# Patient Record
Sex: Male | Born: 1995 | Race: Black or African American | Hispanic: No | Marital: Single | Smoking: Never smoker
Health system: Southern US, Community
[De-identification: ages and names within clinical notes are randomized; demographics above are authoritative.]

## PROBLEM LIST (undated history)

## (undated) DIAGNOSIS — E063 Autoimmune thyroiditis: Secondary | ICD-10-CM

## (undated) DIAGNOSIS — R630 Anorexia: Secondary | ICD-10-CM

## (undated) DIAGNOSIS — F909 Attention-deficit hyperactivity disorder, unspecified type: Secondary | ICD-10-CM

## (undated) DIAGNOSIS — E300 Delayed puberty: Secondary | ICD-10-CM

## (undated) DIAGNOSIS — R569 Unspecified convulsions: Secondary | ICD-10-CM

## (undated) DIAGNOSIS — R6250 Unspecified lack of expected normal physiological development in childhood: Secondary | ICD-10-CM

## (undated) DIAGNOSIS — E049 Nontoxic goiter, unspecified: Secondary | ICD-10-CM

## (undated) HISTORY — DX: Unspecified lack of expected normal physiological development in childhood: R62.50

## (undated) HISTORY — DX: Autoimmune thyroiditis: E06.3

## (undated) HISTORY — PX: CIRCUMCISION: SUR203

## (undated) HISTORY — DX: Nontoxic goiter, unspecified: E04.9

## (undated) HISTORY — DX: Attention-deficit hyperactivity disorder, unspecified type: F90.9

## (undated) HISTORY — DX: Delayed puberty: E30.0

## (undated) HISTORY — DX: Anorexia: R63.0

---
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
## 2014-01-02 ENCOUNTER — Ambulatory Visit (INDEPENDENT_AMBULATORY_CARE_PROVIDER_SITE_OTHER): Payer: 59 | Admitting: Pediatrics

## 2014-01-02 ENCOUNTER — Encounter: Payer: Self-pay | Admitting: Pediatrics

## 2014-01-02 VITALS — BP 110/70 | HR 90 | Ht 67.5 in | Wt 132.4 lb

## 2014-01-02 DIAGNOSIS — G40209 Localization-related (focal) (partial) symptomatic epilepsy and epileptic syndromes with complex partial seizures, not intractable, without status epilepticus: Secondary | ICD-10-CM

## 2014-01-02 DIAGNOSIS — L80 Vitiligo: Secondary | ICD-10-CM

## 2014-01-02 DIAGNOSIS — F909 Attention-deficit hyperactivity disorder, unspecified type: Secondary | ICD-10-CM

## 2014-01-02 DIAGNOSIS — G40309 Generalized idiopathic epilepsy and epileptic syndromes, not intractable, without status epilepticus: Secondary | ICD-10-CM

## 2014-01-02 DIAGNOSIS — G40109 Localization-related (focal) (partial) symptomatic epilepsy and epileptic syndromes with simple partial seizures, not intractable, without status epilepticus: Secondary | ICD-10-CM

## 2014-01-02 DIAGNOSIS — E063 Autoimmune thyroiditis: Secondary | ICD-10-CM

## 2014-01-02 MED ORDER — LAMICTAL XR 250 MG PO TB24: ORAL_TABLET | ORAL | Status: DC

## 2014-01-02 NOTE — Progress Notes (Signed)
Patient: Mark Nicholson MRN: [id number] Sex: male DOB: 08-Jan-1996  Provider: Deetta Perla, MD Location of Care: [facility]  Note type: Routine return visit  History of Present Illness: Referral Source: Dr. Rosanne Ashing History from: mother, patient and [facility] chart Chief Complaint: Seizures  Mark Nicholson is a 18 y.o. male who returns for evaluation and management of seizures.  "Mark Nicholson" returns on January 02, 2014 for the first time since April 25, 2013.  He had a single generalized tonic-clonic seizure on September 22, 2010.  EEG performed on October 02, 2010 showed brief generalized electrographic seizures of four seconds in duration without clinical accompaniments.  The patient had a recurrent seizure on April 10, 2013 on that day he was up until 4 a.m. and slept until 11 or 12.  As he was getting ready to go to a dermatology appointment, he fell to the floor and had rhythmic generalized jerking.  Lamictal was increased to 125 mg twice daily.  Four days later he had a drug level of 5.1 mcg/mL.  EEG on September 27, 2012 was normal.  The patient was taking cyproheptadine for weight loss in an attempt to stabilize his weight.  He is followed by Dr. Molli Knock.  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  In the interim since he was seen, he has been seizure-free.  He now takes Lamictal XR 250 mg at nighttime.  He is in his senior year at [employer].  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  He works for the [employer] duties.  He has the job about 23 hours a week.  He has straight A's in school.  He has been accepted to [employer] and is on the waiting list at [employer].  He is doing a better job of getting sleep.  He has had no serious illnesses during this past six months.  His only medications include Vyvanse for attention deficit  disorder and Lamictal XR.  Review of Systems: 12 system review was unremarkable  Past Medical History  Diagnosis Date  . Physical growth delay   . Goiter   . Poor appetite   . ADHD (attention deficit hyperactivity disorder)   . Puberty delay   . Thyroiditis, autoimmune   . Seizures    Hospitalizations: no, Head Injury: no, Nervous System Infections: no, Immunizations up to date: yes Past Medical History Comments: Syncope in the fifth grade which caused him to be dazed, motor tic disorder-class in, short stature, delayed puberty, autoimmune thyroiditis with goiter, attention deficit hyperactivity disorder mixed type, osteochondritis dissecans, Lichen striatus.  Birth History 8 lbs. 1 oz. infant born at [redacted] weeks gestational age tube a 18 year old primigravida.  Gestation complicated by a 27 pound weight gain.  Labor lasted for only one hour. Normal spontaneous vaginal delivery. Nursery course was uneventful. Breast feeding over 11 months.  Growth and development was recalled as normal. Nocturnal enuresis until 18 years of age.  Behavior History none  Surgical History Past Surgical History  Procedure Laterality Date  . Circumcision      Family History family history includes Alcohol abuse in his maternal grandfather; Diabetes in his paternal aunt; Lung cancer in his maternal grandfather; Seizures in his maternal grandfather and paternal grandfather; Seizures (age of onset: 94) in his father; Thyroid disease in his paternal aunt. There is no history of Cancer. Family History is negative migraines, seizures, cognitive  impairment, blindness, deafness, birth defects, chromosomal disorder, autism.  Social History History   Social History  . Marital Status: Single    Spouse Name: N/A    Number of Children: N/A  . Years of Education: N/A   Social History Main Topics  . Smoking status: Never Smoker   . Smokeless tobacco: Never Used  . Alcohol Use: No  . Drug Use: No  . Sexual  Activity: No   Other Topics Concern  . None   Social History Narrative   He is in 12th grade at [employer]   Lives with parents and sister   Studying computers and Public relations account executiveengineering at [employer] in [place].   Has been accepted to [employer], on waiting list at [employer].   Educational level 12th grade School Attending: [employer]. Occupation: Psychologist, sport and exercisetudent/Scanner at [employer] Living with parents and sister  Hobbies/Interest: Enjoys riding his bike, playing video games and watching movies. School comments Tinnie GensJeffrey is doing well in school, he graduates June of 2015.  No current outpatient prescriptions on file prior to visit.   No current facility-administered medications on file prior to visit.   The medication list was reviewed and reconciled. All changes or newly prescribed medications were explained.  A complete medication list was provided to the patient/caregiver.  No Known Allergies  Physical Exam BP 110/70  Pulse 90  Ht 5' 7.5" (1.715 m)  Wt 132 lb 6.4 oz (60.056 kg)  BMI 20.42 kg/m2  General: alert, well developed, well nourished, in no acute distress, black hair, brwon eyes, left handed Head: normocephalic, no dysmorphic features  Ears, Nose and Throat: Otoscopic: Tympanic membranes normal. Pharynx: oropharynx is pink without exudates or tonsillar hypertrophy.  Neck: supple, full range of motion, no cranial or cervical bruits  Respiratory: auscultation clear  Cardiovascular: no murmurs, pulses are normal  Musculoskeletal: no skeletal deformities or apparent scoliosis  Skin: Vitiligo on his face, no neurocutaneous lesions   Neurologic Exam   Mental Status: alert; oriented to person, place and year; knowledge is normal for age; language is normal  Cranial Nerves: visual fields are full to double simultaneous stimuli; extraocular movements are full and conjugate; pupils are around reactive to light; funduscopic examination  shows sharp disc margins with normal vessels; symmetric facial strength; midline tongue and uvula; air conduction is greater than bone conduction bilaterally.  Motor: Normal strength, tone and mass; good fine motor movements; no pronator drift.  Sensory: intact responses to cold, vibration, proprioception and stereognosis  Coordination: good finger-to-nose, rapid repetitive alternating movements and finger apposition  Gait and Station: normal gait and station: patient is able to walk on heels, toes and tandem without difficulty; balance is adequate; Romberg exam is negative; Gower response is negative  Reflexes: symmetric and diminished bilaterally; no clonus; bilateral flexor plantar responses.  Assessment 1. 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 2. Localization related epilepsy with complex partial seizures, 345.40. 3. Generalized convulsive epilepsy, 345.10. 4. Attention deficit disorder with hyperactivity, 314.01. 5. Autoimmune thyroiditis, 245.2. 6. Vitiligo of his face, 709.01.  Discussion The patient has done extremely well with his seizures.  He has again been seizure-free since April 10, 2013.  He needs to be seizure-free for two years before I will consider taking him off medication.  That would be at the end of his freshman year in college.  I would like to see him before he heads off to college to discuss management of his workload and  time, the need to get adequate sleep and to refrain from using alcohol which are factors that could lower his seizure threshold.    I spent 30 minutes of face-to-face time with the patient and his mother more than half of it in consultation.  I refilled the prescription for Lamictal.  Deetta Perla MD

## 2014-01-04 ENCOUNTER — Encounter: Payer: Self-pay | Admitting: Pediatrics

## 2014-01-29 ENCOUNTER — Encounter: Payer: Self-pay | Admitting: Family Medicine

## 2014-01-29 ENCOUNTER — Ambulatory Visit (INDEPENDENT_AMBULATORY_CARE_PROVIDER_SITE_OTHER): Payer: 59 | Admitting: Family Medicine

## 2014-01-29 VITALS — BP 100/68 | Temp 98.5°F | Ht 68.0 in | Wt 139.0 lb

## 2014-01-29 DIAGNOSIS — E063 Autoimmune thyroiditis: Secondary | ICD-10-CM

## 2014-01-29 DIAGNOSIS — Z7689 Persons encountering health services in other specified circumstances: Secondary | ICD-10-CM

## 2014-01-29 DIAGNOSIS — L80 Vitiligo: Secondary | ICD-10-CM

## 2014-01-29 DIAGNOSIS — Z7189 Other specified counseling: Secondary | ICD-10-CM

## 2014-01-29 DIAGNOSIS — E049 Nontoxic goiter, unspecified: Secondary | ICD-10-CM

## 2014-01-29 DIAGNOSIS — F909 Attention-deficit hyperactivity disorder, unspecified type: Secondary | ICD-10-CM

## 2014-01-29 DIAGNOSIS — G40109 Localization-related (focal) (partial) symptomatic epilepsy and epileptic syndromes with simple partial seizures, not intractable, without status epilepticus: Secondary | ICD-10-CM

## 2014-01-29 MED ORDER — LISDEXAMFETAMINE DIMESYLATE 30 MG PO CAPS: 30.0000 mg | ORAL_CAPSULE | Freq: Every day | ORAL | Status: DC

## 2014-01-29 NOTE — Progress Notes (Signed)
Pre visit review using our clinic review tool, if applicable. No additional management support is needed unless otherwise documented below in the visit note. 

## 2014-01-29 NOTE — Progress Notes (Signed)
Chief Complaint  Patient presents with  . Establish Care    HPI:  Mark Nicholson is here to establish care.  Last PCP and physical:  Has the following chronic problems and concerns today:  Patient Active Problem List   Diagnosis Date Noted  . Generalized convulsive epilepsy without mention of intractable epilepsy - sees Dr. Sharene SkeansHickling, Neurology 04/14/2013  . Localization-related (focal) (partial) epilepsy and epileptic syndromes with simple partial seizures, without mention of intractable epilepsy 04/14/2013  . Vitiligo - Sees Dr. Yetta BarreJones 05/29/2012  . Physical growth delay   . Goiter   . ADHD (attention deficit hyperactivity disorder)   . Puberty delay   . Thyroiditis, autoimmune - followed by endocrinology     Health Maintenance:  ROS: See pertinent positives and negatives per HPI.  Past Medical History  Diagnosis Date  . Physical growth delay   . Goiter   . Poor appetite   . ADHD (attention deficit hyperactivity disorder)   . Puberty delay   . Thyroiditis, autoimmune   . Seizures     Family History  Problem Relation Age of Onset  . Diabetes Paternal Aunt   . Thyroid disease Paternal Aunt   . Cancer Neg Hx   . Seizures Father 96    6-13 yo unresponsive staring, generalized clonic  . Lung cancer Maternal Grandfather   . Seizures Maternal Grandfather     Related to alcohol use and sleep deprivation. treated w phenobarbital  . Alcohol abuse Maternal Grandfather   . Seizures Paternal Grandfather     History   Social History  . Marital Status: Single    Spouse Name: N/A    Number of Children: N/A  . Years of Education: N/A   Social History Main Topics  . Smoking status: Never Smoker   . Smokeless tobacco: Never Used  . Alcohol Use: No  . Drug Use: No  . Sexual Activity: No   Other Topics Concern  . 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   Has been accepted to [employer], on waiting list at [employer]. 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          Current outpatient prescriptions:LAMICTAL XR 250 MG TB24, TAKE 1 TABLET BY MOUTH EVERY NIGHT AT BEDTIME, Disp: 31 tablet, Rfl: 5;  lisdexamfetamine (VYVANSE) 30 MG capsule, Take 1 capsule (30 mg total) by mouth daily., Disp: 60 capsule, Rfl: 0  EXAM:  Filed Vitals:   01/29/14 0807  BP: 100/68  Temp: 98.5 F (36.9 C)    Body mass index is 21.14 kg/(m^2).  GENERAL: vitals reviewed and listed above, alert, oriented, appears well hydrated and in no acute distress  HEENT: atraumatic, conjunttiva clear, no obvious abnormalities on inspection of external nose and ears  NECK: no obvious masses on inspection  LUNGS: clear to auscultation bilaterally, no wheezes, rales or rhonchi, good air movement  CV: HRRR, no peripheral edema  MS: moves all extremities without noticeable abnormality  PSYCH: pleasant and cooperative, no obvious depression or anxiety  ASSESSMENT AND PLAN:  Discussed the following assessment and plan:  ADHD (attention deficit hyperactivity disorder) - Plan: lisdexamfetamine (VYVANSE) 30 MG capsule -they prefer to see psych for now for this and then in 1 year would like to try to wean off of medications -advised counseling, adequate sleep, regular exercise  in the interim, refilled enough medication to get them to their next appt  Encounter to establish care  Thyroiditis, autoimmune - followed by endocrinology -recent notes reviewed  Vitiligo - Sees Dr. Yetta BarreJones - Plan: Ambulatory referral to Dermatology -referral placed per mother request  Goiter  Localization-related (focal) (partial) epilepsy and epileptic syndromes with simple partial seizures, without mention of intractable epilepsy -recent neurology notes reviewed  -We reviewed the PMH, PSH, FH, SH, Meds and Allergies. -We provided refills for any medications we  will prescribe as needed. -We addressed current concerns per orders and patient instructions. -We have asked for records for pertinent exams, studies, vaccines and notes from previous providers. -We have advised patient to follow up per instructions below. -follow up for physical before college  -Patient advised to return or notify a doctor immediately if symptoms worsen or persist or new concerns arise.  Patient Instructions  -please call to schedule appointment with psychiatry   -follow up as needed     Terressa KoyanagiHannah R. Doylene Splinter

## 2014-01-29 NOTE — Patient Instructions (Signed)
-  please call to schedule appointment with psychiatry   -follow up as needed

## 2014-01-30 ENCOUNTER — Other Ambulatory Visit: Payer: Self-pay | Admitting: *Deleted

## 2014-01-30 ENCOUNTER — Telehealth: Payer: Self-pay | Admitting: Family Medicine

## 2014-01-30 DIAGNOSIS — F909 Attention-deficit hyperactivity disorder, unspecified type: Secondary | ICD-10-CM

## 2014-01-30 DIAGNOSIS — E038 Other specified hypothyroidism: Secondary | ICD-10-CM

## 2014-01-30 DIAGNOSIS — R6250 Unspecified lack of expected normal physiological development in childhood: Secondary | ICD-10-CM

## 2014-01-30 NOTE — Telephone Encounter (Signed)
If they can bring the old prescription back we can exchange it for a 30 day supply. If pharmacy has rx - please verify this with pharmacy before providing another rx. Thanks.

## 2014-01-30 NOTE — Telephone Encounter (Signed)
Pt's mother calling on behalf of patient to state insurance would only approve refill for 1 month supply of lisdexamfetamine (VYVANSE) 30 MG capsule.  However, PCP wrote the rx for a 2 mnth supply.  Since insurance would only fill enough for 1 mnth, pt will need a 30 day script for next month.

## 2014-01-31 NOTE — Telephone Encounter (Signed)
Per mother only one prescription and it was done for only 31 days.  Advised that I would call the pharmacy to verify information and she would be called when new rx is ready for pick up.

## 2014-02-05 NOTE — Telephone Encounter (Signed)
I am not sure what is needed?

## 2014-02-05 NOTE — Telephone Encounter (Signed)
Called pharmacy and spoke with [employer]. Pt bought the rx to the pharmacy and was written for 90 but since pt had a discount card, it only allowed for 30 at a time.  They need a new rx for next month.  Pls advise.

## 2014-02-06 MED ORDER — LISDEXAMFETAMINE DIMESYLATE 30 MG PO CAPS: 30.0000 mg | ORAL_CAPSULE | Freq: Every day | ORAL | Status: DC

## 2014-02-06 NOTE — Addendum Note (Signed)
Addended by: Azucena FreedMILLNER, Ledarrius Beauchaine C on: 02/06/2014 11:31 AM   Modules accepted: Orders

## 2014-02-06 NOTE — Telephone Encounter (Signed)
Ok per Dr. Selena BattenKim to write another rx for 30 days.  Called and left a vm for pt's mother that rx ready for pick and an ID and signature is needed.

## 2014-02-06 NOTE — Telephone Encounter (Signed)
Had to reprint rx- did not print the first time.

## 2014-02-12 ENCOUNTER — Encounter: Payer: Self-pay | Admitting: "Endocrinology

## 2014-02-12 ENCOUNTER — Ambulatory Visit (INDEPENDENT_AMBULATORY_CARE_PROVIDER_SITE_OTHER): Payer: 59 | Admitting: "Endocrinology

## 2014-02-12 ENCOUNTER — Ambulatory Visit
Admission: RE | Admit: 2014-02-12 | Discharge: 2014-02-12 | Disposition: A | Discharge status: Home / Self Care | Payer: 59 | Source: Ambulatory Visit | Attending: "Endocrinology | Admitting: "Endocrinology

## 2014-02-12 ENCOUNTER — Other Ambulatory Visit: Payer: Self-pay | Admitting: "Endocrinology

## 2014-02-12 VITALS — BP 100/66 | HR 80 | Ht 68.31 in | Wt 134.7 lb

## 2014-02-12 DIAGNOSIS — N62 Hypertrophy of breast: Secondary | ICD-10-CM

## 2014-02-12 DIAGNOSIS — E063 Autoimmune thyroiditis: Secondary | ICD-10-CM

## 2014-02-12 DIAGNOSIS — E049 Nontoxic goiter, unspecified: Secondary | ICD-10-CM

## 2014-02-12 DIAGNOSIS — E300 Delayed puberty: Secondary | ICD-10-CM

## 2014-02-12 DIAGNOSIS — L80 Vitiligo: Secondary | ICD-10-CM

## 2014-02-12 DIAGNOSIS — R6252 Short stature (child): Secondary | ICD-10-CM

## 2014-02-12 NOTE — Progress Notes (Signed)
Subjective:  Patient Name: Mark Nicholson Date of Birth: 07/13/1996  MRN: [id number]  Mark Nicholson  presents to the office today for follow-up evaluation and management of his delayed puberty, poor weight gain, growth delay, gynecomastia, vitiligo, and short stature.  HISTORY OF PRESENT ILLNESS:   Mark Nicholson is a 18 y.o. AA young man.  Mark Nicholson was unaccompanied.  1.  "Mark Nicholson" was first seen in our clinic on 01/28/09 after referral by his primary care provider, Dr. Randell Loop of [facility] for evaluation and management of growth delay in association with taking medication for ADHD. The patient was then 13-1/2.  He reportedly had previously been at about the 50% on both the height growth curve and weight growth curve. His appetite decreased markedly on ADD medications. His growth fell off as well. The patient was only hungry at the very end of the day. He was started on cyproheptadine and liberalization of his diet, and responded well. He did try stopping the cyproheptadine but his weight fell.  He also had a delayed start to puberty secondary to poor weight gain poor growth. Since neither he nor his mother wanted him to take cyproheptadine, I agreed in May 2014 to stop the cyproheptadine as long as he continued to eat and to grow.  2. [name]'s last [facility] visit was on 07/24/13.   A. In the interim, he has been pretty healthy. His appetite is pretty good. He feels that his vitiligo is improving.    B. He has been followed for pubertal delay. Pubic hair, axillary hair, and genitalia are all increasing in amount/size.    3. Pertinent Review of Systems:  Constitutional: The patient feels "good". The patient seems healthy and active. Eyes: Vision seems to be good with his current eye glasses or contacts. There are no recognized eye problems. Neck: The patient has no complaints of anterior neck swelling, soreness, tenderness, pressure, discomfort, or difficulty swallowing.  Heart: Heart  rate increases with exercise or other physical activity. The patient has no complaints of palpitations, irregular heart beats, chest pain, or chest pressure.   Gastrointestinal: Bowel movents seem normal. The patient has no complaints of excessive hunger, acid reflux, upset stomach, stomach aches or pains, diarrhea, or constipation.  Legs: Muscle mass and strength seem normal. There are no complaints of numbness, tingling, burning, or pain. No edema is noted.  Feet: There are no obvious foot problems. There are no complaints of numbness, tingling, burning, or pain. No edema is noted. Neurologic: There are no recognized problems with muscle movement and strength, sensation, or coordination. Chest:  Breast are smaller.  Skin: He feels that his vitiligo is better after beginning UV light therapy at [facility]. He has the UV therapy 3 times per week.    PAST MEDICAL, FAMILY, AND SOCIAL HISTORY  Past Medical History  Diagnosis Date  . Physical growth delay   . Goiter   . Poor appetite   . ADHD (attention deficit hyperactivity disorder)   . Puberty delay   . Thyroiditis, autoimmune   . Seizures     Family History  Problem Relation Age of Onset  . Diabetes Paternal Aunt   . Thyroid disease Paternal Aunt   . Cancer Neg Hx   . Seizures Father 78    6-13 yo unresponsive staring, generalized clonic  . Lung cancer Maternal Grandfather   . Seizures Maternal Grandfather     Related to alcohol use and sleep deprivation. treated w phenobarbital  . Alcohol abuse Maternal Grandfather   .  Seizures Paternal Grandfather     Current outpatient prescriptions:LAMICTAL XR 250 MG TB24, TAKE 1 TABLET BY MOUTH EVERY NIGHT AT BEDTIME, Disp: 31 tablet, Rfl: 5;  lisdexamfetamine (VYVANSE) 30 MG capsule, Take 1 capsule (30 mg total) by mouth daily., Disp: 30 capsule, Rfl: 0  Allergies as of 02/12/2014  . (No Known Allergies)     reports that he has never smoked. He has never used smokeless  tobacco. He reports that he does not drink alcohol or use illicit drugs. Pediatric History  Patient Guardian Status  . Mother:  Christean LeafMeadows,[name]   Other Topics Concern  . 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   Has been accepted to [employer], on waiting list at [employer]. 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        1. School and family: He is in his senior year. He will attend [employer] next year. He wants to major in computer and Actuaryelectrical engineering. 2. Activities: He rides his bike and walks.   3. Primary Care Provider: Terressa KoyanagiKIM, HANNAH R., DO, [facility], [place]  REVIEW OF SYSTEMS: There are no other significant problems involving [name]'s other body systems.   Objective:  Vital Signs:  BP 100/66  Pulse 80  Ht 5' 8.31" (1.735 m)  Wt 134 lb 11.2 oz (61.1 kg)  BMI 20.30 kg/m2   Ht Readings from Last 3 Encounters:  02/12/14 5' 8.31" (1.735 m) (35%*, Z = -0.40)  01/29/14 5\' 8"  (1.727 m) (31%*, Z = -0.50)  01/02/14 5' 7.5" (1.715 m) (25%*, Z = -0.66)   * Growth percentiles are based on CDC 2-20 Years data.   Wt Readings from Last 3 Encounters:  02/12/14 134 lb 11.2 oz (61.1 kg) (25%*, Z = -0.69)  01/29/14 139 lb (63.05 kg) (32%*, Z = -0.47)  01/02/14 132 lb 6.4 oz (60.056 kg) (22%*, Z = -0.79)   * Growth percentiles are based on CDC 2-20 Years data.   HC Readings from Last 3 Encounters:  [facility]   Body surface area is 1.72 meters squared. 35%ile (Z=-0.40) based on CDC 2-20 Years stature-for-age data. 25%ile (Z=-0.69) based on CDC 2-20 Years weight-for-age data.    PHYSICAL EXAM:  Constitutional: The patient appears healthy, but slender. His height is has increased to the 34%. He has gained 1 lb on our scale since his last visit in October. His weight is now at the 24.51%.   Head: The head is  normocephalic. Face: His vitiligo is better. He has more pink area and fewer white areas. The pigment is slowly returning toward normal.  Eyes: The eyes appear to be normally formed and spaced. Gaze is conjugate. There is no obvious arcus or proptosis. Moisture appears normal. Ears: The ears are normally placed and appear externally normal. Mouth: The oropharynx and tongue appear normal. Dentition appears to be normal for age. Oral moisture is normal. Neck: The neck appears to be visibly normal. No carotid bruits are noted. The thyroid gland is smaller at 22-23 grams in size. Both lobes are enlarged, the left more than the right. The consistency of the thyroid gland is relatively normal. The thyroid gland is not tender to palpation. He has +1 acanthosis. Lungs: The lungs are clear to auscultation. Air movement is good. Heart: Heart rate and rhythm  are regular. Heart sounds S1 and S2 are normal. I did not appreciate any pathologic cardiac murmurs. Abdomen: The abdomen is normal in size for the patient's age. Bowel sounds are normal. There is no obvious hepatomegaly, splenomegaly, or other mass effect.  Arms: Muscle size and bulk are normal for age. Hands: There is no obvious tremor. Phalangeal and metacarpophalangeal joints are normal. Palmar muscles are normal for age. Palmar skin is normal. Palmar moisture is also normal. Legs: Muscles appear normal for age. No edema is present. Neurologic: Strength is normal for age in both the upper and lower extremities. Muscle tone is normal. Sensation to touch is normal in both legs.   Breasts: Tanner stage I. Breast buds are not palpable. Right areola 26 mm, left 29 mm Pubic hair Tanner stage IV. Right testis is 12 mL in volume. Left testis is 10 mL.  LAB DATA: 02/12/14: Results are pending. 07/19/13: TSH 0.756, free T4 1.19, free T3 3.6 02/13/13: TSH 1.333, free T4 0.98, free T3 3.2 10/03/12: TSH 1.285, free T4 1.34, free T3 3.5  12/28/11: TSH 2.154, free  T4 1.18, free T3 3.6  IMAGING: Bone age on 02/20/13: BA 14 at CA 17. I reviewed the study. The BA is actually 14.5 years.   Assessment and Plan:   ASSESSMENT:  1. Growth delay: He has grown taller since his last visit. Since his BA in May 2014 was 14.5 at a CA of 17.5, he probably has about another 6-12 months of linear growth remaining. It is important for him not to lose weight during the next 12 months that he needs calories to grow.  2. Gynecomastia: His breast buds have disappeared. His areolae are flat. His gynecomastia has resolved. We do note need to follow this problem any longer.     3. Pubertal delay: His puberty is progressing, albeit slowly.  4. Decreased appetite/weight loss: These problems have resolved. 5. Vitiligo/Hypopigmentation: The vitiligo seems to be improving.  6. Goiter: His thyroid gland is smaller and softer today. These findings imply less inflammation by Hashimoto's disease WBCs. He will probably become hypothyroid in the next 5 years.  His TFTs have been mid-range normal since December 2013. He is clinically euthyroid today.  7. Hashimoto's Disease: The presence of positive antibodies in the setting of a goiter that waxes and wanes and his positive family history all suggest that Mark Nicholson will eventually develop hypothyroidism. At present, however, the thyroiditis is clinically quiescent.  PLAN:  1. Diagnostic: TFTs, LH, FSH, and testosterone prior to next visit.Bone age today. Add testosterone to his labs drawn earlier today. Also add LH and FSH if possible.  2. Therapeutic: Remain off cyproheptadine unless directed to re-start it. Eat normally. 3. Patient education: Discussed puberty, pubertal advancement, gynecomastia, growth, vitiligo, and autoimmune diseases.  4. Follow-up: 6 months   Level of Service: This visit lasted in excess of 40 minutes. More than 50% of the visit was devoted to counseling.  David StallMichael J Jakeria Caissie, MD

## 2014-02-12 NOTE — Patient Instructions (Signed)
Follow up visit in 6 months. Please have lab tests done 1-2 weeks prior to your next appointment.

## 2014-02-13 LAB — T4, FREE: Free T4: 1.13 ng/dL (ref 0.80–1.80)

## 2014-02-13 LAB — T3, FREE: T3, Free: 3.4 pg/mL (ref 2.3–4.2)

## 2014-02-13 LAB — TSH: TSH: 1.06 u[IU]/mL (ref 0.350–4.500)

## 2014-02-15 ENCOUNTER — Telehealth: Payer: Self-pay | Admitting: Family Medicine

## 2014-02-15 NOTE — Telephone Encounter (Addendum)
Pt needs another referral to dr drew jones for lightbox treatment for vitiligo. Pt has appt on 02-16-14

## 2014-02-16 ENCOUNTER — Encounter: Payer: Self-pay | Admitting: *Deleted

## 2014-02-19 ENCOUNTER — Other Ambulatory Visit: Payer: Self-pay

## 2014-02-21 ENCOUNTER — Ambulatory Visit (HOSPITAL_COMMUNITY): Payer: 59 | Admitting: Psychiatry

## 2014-03-16 ENCOUNTER — Telehealth: Payer: Self-pay | Admitting: Family Medicine

## 2014-03-16 NOTE — Telephone Encounter (Signed)
Pt' s mom states pt needs new referral for pt to see the dermatology. Mom would like you to call her to ensure this has been done. She states one has to be done every 6 visits.   [facility]

## 2014-03-16 NOTE — Telephone Encounter (Signed)
[id number] [facility] referral completed to Dr Donzetta Starch  For 6 visits   Start Date :  03/16/2014  Valid to Date :  09/16/2014  Number Of Visits:  6 Specialist Name:  Rocco Serene  Specialist ID:  [id number]   Specialist Address:  [address], [place], [place], [address]  Specialist Phone Number:  [phone]   Medical Group Name:  [facility]  Informed patient that authorization was done

## 2014-03-26 ENCOUNTER — Telehealth: Payer: Self-pay | Admitting: Family Medicine

## 2014-03-26 DIAGNOSIS — F909 Attention-deficit hyperactivity disorder, unspecified type: Secondary | ICD-10-CM

## 2014-03-26 NOTE — Telephone Encounter (Signed)
I called the pts mother and advised her she should contact psychiatry for a refill on Vyvanse per Dr Elmyra Ricks last note.  She requested one more refill until his appt with the psychiatrist in July and I informed her the message will be forwarded to Dr Clent Ridges.

## 2014-03-26 NOTE — Telephone Encounter (Signed)
Pt need new rx vyvanse 30 mg.

## 2014-03-27 MED ORDER — LISDEXAMFETAMINE DIMESYLATE 30 MG PO CAPS: 30.0000 mg | ORAL_CAPSULE | Freq: Every day | ORAL | Status: DC

## 2014-03-27 NOTE — Telephone Encounter (Signed)
I called the pts home number and left a detailed message for his mother Cala Bradford that the Rx was written for 1 month only and left at the front desk for her to pick up.

## 2014-03-27 NOTE — Telephone Encounter (Signed)
Written for one month only

## 2014-03-28 ENCOUNTER — Telehealth: Payer: Self-pay | Admitting: Family Medicine

## 2014-03-28 NOTE — Telephone Encounter (Signed)
Pt needs another referral to see dr drew Yetta Barre on 03-30-14 for lightbox dx vitaligo. [facility] ins

## 2014-03-30 NOTE — Telephone Encounter (Signed)
Referral completed, notified pt's mother.  Also, encouraged mom to have patient enroll in mychart since he will need to have a referral submitted biweekly due to treatment that requires 3 visits per week.  Mychart instructions emailed to patient.

## 2014-04-09 ENCOUNTER — Telehealth: Payer: Self-pay | Admitting: Family Medicine

## 2014-04-09 NOTE — Telephone Encounter (Signed)
Pt's mother called in asking for referral for pt to dermatologist.  I did let pt know that she does not need to call in weekly.  Pt's mom verbalized understanding and wanted to make sure that this referral was done every 5 visits.

## 2014-04-09 NOTE — Telephone Encounter (Signed)
Referral was done - authorization  For compass # [id number]         04/09/2014 Valid to Date :  10/09/2014  Number Of Visits:  6  Faxed to   Dr drew office  [facility] [address] [place], [place] [address] [phone]([phone] (Fax)

## 2014-04-24 ENCOUNTER — Ambulatory Visit (HOSPITAL_COMMUNITY): Payer: 59 | Admitting: Psychiatry

## 2014-04-30 ENCOUNTER — Encounter: Payer: Self-pay | Admitting: *Deleted

## 2014-04-30 ENCOUNTER — Ambulatory Visit (INDEPENDENT_AMBULATORY_CARE_PROVIDER_SITE_OTHER): Payer: 59 | Admitting: *Deleted

## 2014-04-30 ENCOUNTER — Telehealth: Payer: Self-pay | Admitting: *Deleted

## 2014-04-30 DIAGNOSIS — F908 Attention-deficit hyperactivity disorder, other type: Secondary | ICD-10-CM

## 2014-04-30 DIAGNOSIS — Z23 Encounter for immunization: Secondary | ICD-10-CM

## 2014-04-30 MED ORDER — LISDEXAMFETAMINE DIMESYLATE 30 MG PO CAPS: 30.0000 mg | ORAL_CAPSULE | Freq: Every day | ORAL | Status: DC

## 2014-04-30 NOTE — Telephone Encounter (Signed)
Dr Kim-pt is here for his Menveo injection and the pts mother requested a refill on Vyvanse as they cannot get an appt until August with the specialist.  Can you give one more refill?

## 2014-04-30 NOTE — Telephone Encounter (Signed)
Patient came in as a nurse pt today for a Menveo vaccine.  I looked at the pts chart and there were no immunizations listed and the Menveo injection was given.  After entering this injection into the state registry I noticed the pt has already had 2 injections of this type.  Dr Selena BattenKim was informed, she reviewed the copy of the state registry report and stated this should not cause any harm to the pt and to let Tim LairSuandrea know about this.  I informed Tim LairSuandrea and she stated to submit a incident report and to inform the pt and the patient's

## 2014-04-30 NOTE — Telephone Encounter (Signed)
Rx left at the front desk.

## 2014-04-30 NOTE — Telephone Encounter (Signed)
Ok to give 1 month 

## 2014-04-30 NOTE — Telephone Encounter (Signed)
Rx was left at the front desk and I called the pts mother and she is aware this is ready for pick-up.

## 2014-04-30 NOTE — Telephone Encounter (Signed)
Mother was informed and she is aware there will be NO charge for today.  A copy of the state registry was left at the front desk also.

## 2014-05-09 ENCOUNTER — Telehealth: Payer: Self-pay | Admitting: Family Medicine

## 2014-05-09 NOTE — Telephone Encounter (Signed)
Pt is needing a referral for a dermatologist. Mom states pt has appt today at 2:00pm. Pt is needing the referral to get a light box. Pt is requesting a call back once referral is done

## 2014-05-09 NOTE — Telephone Encounter (Signed)
Error/gd °

## 2014-05-09 NOTE — Telephone Encounter (Signed)
Referral  submitted  For pt to see [facility]: Rocco SereneJones Drew A MD  Address: [address], [place], [place] [address]  [phone] Authorization  [facility] # [id number]  Faxed a copy to [phone] dr Yetta Barrejones office

## 2014-05-15 ENCOUNTER — Encounter: Payer: Self-pay | Admitting: Family Medicine

## 2014-05-21 ENCOUNTER — Telehealth: Payer: Self-pay | Admitting: Family Medicine

## 2014-05-21 NOTE — Telephone Encounter (Signed)
Referral submitted For pt to see [facility]: Rocco SereneJones Drew A MD  Address: [address], [place], [place] [address]  [phone]  Authorization [employer] #  [id number] Faxed a copy to [phone] dr Yetta Barrejones office

## 2014-05-21 NOTE — Telephone Encounter (Signed)
Pt needs a referral today to see dr Yetta Barrejones for lightbox treatment. Pt goes 3 times a wk. Pt has [facility]. Please call mom once referral has been fax

## 2014-05-25 ENCOUNTER — Emergency Department (HOSPITAL_COMMUNITY)
Admission: EM | Admit: 2014-05-25 | Discharge: 2014-05-25 | Disposition: A | Discharge status: Home / Self Care | Payer: 59 | Attending: Emergency Medicine | Admitting: Emergency Medicine

## 2014-05-25 ENCOUNTER — Encounter (HOSPITAL_COMMUNITY): Payer: Self-pay | Admitting: Emergency Medicine

## 2014-05-25 DIAGNOSIS — G40909 Epilepsy, unspecified, not intractable, without status epilepticus: Secondary | ICD-10-CM | POA: Diagnosis not present

## 2014-05-25 DIAGNOSIS — Z862 Personal history of diseases of the blood and blood-forming organs and certain disorders involving the immune mechanism: Secondary | ICD-10-CM | POA: Insufficient documentation

## 2014-05-25 DIAGNOSIS — F909 Attention-deficit hyperactivity disorder, unspecified type: Secondary | ICD-10-CM | POA: Insufficient documentation

## 2014-05-25 DIAGNOSIS — R000 Tachycardia, unspecified: Secondary | ICD-10-CM | POA: Insufficient documentation

## 2014-05-25 DIAGNOSIS — R569 Unspecified convulsions: Secondary | ICD-10-CM | POA: Insufficient documentation

## 2014-05-25 DIAGNOSIS — Z79899 Other long term (current) drug therapy: Secondary | ICD-10-CM | POA: Insufficient documentation

## 2014-05-25 DIAGNOSIS — Z8639 Personal history of other endocrine, nutritional and metabolic disease: Secondary | ICD-10-CM | POA: Insufficient documentation

## 2014-05-25 LAB — URINALYSIS, ROUTINE W REFLEX MICROSCOPIC
Bilirubin Urine: NEGATIVE
GLUCOSE, UA: NEGATIVE mg/dL
HGB URINE DIPSTICK: NEGATIVE
Ketones, ur: NEGATIVE mg/dL
Leukocytes, UA: NEGATIVE
Nitrite: NEGATIVE
Protein, ur: 100 mg/dL — AB
Specific Gravity, Urine: 1.016 (ref 1.005–1.030)
Urobilinogen, UA: 0.2 mg/dL (ref 0.0–1.0)
pH: 5.5 (ref 5.0–8.0)

## 2014-05-25 LAB — CBC
HEMATOCRIT: 40.2 % (ref 39.0–52.0)
HEMOGLOBIN: 14 g/dL (ref 13.0–17.0)
MCH: 30.9 pg (ref 26.0–34.0)
MCHC: 34.8 g/dL (ref 30.0–36.0)
MCV: 88.7 fL (ref 78.0–100.0)
Platelets: 231 10*3/uL (ref 150–400)
RBC: 4.53 MIL/uL (ref 4.22–5.81)
RDW: 12.6 % (ref 11.5–15.5)
WBC: 4.3 10*3/uL (ref 4.0–10.5)

## 2014-05-25 LAB — RAPID URINE DRUG SCREEN, HOSP PERFORMED
Amphetamines: POSITIVE — AB
BENZODIAZEPINES: NOT DETECTED
Barbiturates: NOT DETECTED
Cocaine: NOT DETECTED
Opiates: NOT DETECTED
Tetrahydrocannabinol: NOT DETECTED

## 2014-05-25 LAB — BASIC METABOLIC PANEL
Anion gap: 16 — ABNORMAL HIGH (ref 5–15)
BUN: 10 mg/dL (ref 6–23)
CHLORIDE: 103 meq/L (ref 96–112)
CO2: 23 meq/L (ref 19–32)
CREATININE: 0.85 mg/dL (ref 0.50–1.35)
Calcium: 10 mg/dL (ref 8.4–10.5)
GFR calc Af Amer: >90 mL/min (ref >90)
GFR calc non Af Amer: >90 mL/min (ref >90)
GLUCOSE: 90 mg/dL (ref 70–99)
Potassium: 4.9 mEq/L (ref 3.7–5.3)
Sodium: 142 mEq/L (ref 137–147)

## 2014-05-25 LAB — URINE MICROSCOPIC-ADD ON

## 2014-05-25 LAB — CBG MONITORING, ED: GLUCOSE-CAPILLARY: 84 mg/dL (ref 70–99)

## 2014-05-25 MED ORDER — ACETAMINOPHEN 325 MG PO TABS
650.0000 mg | ORAL_TABLET | Freq: Once | ORAL | Status: AC
  Administered 2014-05-25: 650 mg via ORAL
  Filled 2014-05-25: qty 2

## 2014-05-25 MED ORDER — LORAZEPAM 1 MG PO TABS
1.0000 mg | ORAL_TABLET | Freq: Once | ORAL | Status: AC
  Administered 2014-05-25: 1 mg via ORAL
  Filled 2014-05-25: qty 1

## 2014-05-25 NOTE — ED Notes (Signed)
Patient states had just come from dermatology appointment today and went to [facility].  Patient had a seizure that lasted about 4 minutes.  Patient states that he is fine at this time with no problems from the seizures.   Mother and father at side with patient.   Patient states has been taking lamictal for seizures and has not missed any dosages.

## 2014-05-25 NOTE — ED Notes (Signed)
Phlebotomy at bedside.

## 2014-05-25 NOTE — ED Provider Notes (Signed)
CSN: [id number]     Arrival date & time 05/25/14  1505 History   First MD Initiated Contact with Patient 05/25/14 1549     Chief Complaint  Patient presents with  . Seizures     (Consider location/radiation/quality/duration/timing/severity/associated sxs/prior Treatment) HPI Comments: The patient is a 18 year old male past history of seizures presents emergency room chief complaint of witness seizure that occurred today. Per the patient's father the patient has approximately 3.5-4 minute seizure in the car. He reports he was opening denies hitting head. The patient's father reports post ictal for a short time, and returned to baseline. No recent change in medication.  Patient reports last seizure June 2014, this is his 3rd seizure, followed by Dr. Ellison Carwin (neurology). Patient reports compliance with Lamictal 250mg . Denies lightheadedness, dizziness, headache, visual changes, weakness, paresthesias.  The history is provided by the patient. No language interpreter was used.    Past Medical History  Diagnosis Date  . Physical growth delay   . Goiter   . Poor appetite   . ADHD (attention deficit hyperactivity disorder)   . Puberty delay   . Thyroiditis, autoimmune   . Seizures    Past Surgical History  Procedure Laterality Date  . Circumcision     Family History  Problem Relation Age of Onset  . Diabetes Paternal Aunt   . Thyroid disease Paternal Aunt   . Cancer Neg Hx   . Seizures Father 2    6-13 yo unresponsive staring, generalized clonic  . Lung cancer Maternal Grandfather   . Seizures Maternal Grandfather     Related to alcohol use and sleep deprivation. treated w phenobarbital  . Alcohol abuse Maternal Grandfather   . Seizures Paternal Grandfather    History  Substance Use Topics  . Smoking status: Never Smoker   . Smokeless tobacco: Never Used  . Alcohol Use: No    Review of Systems  Constitutional: Negative for fever and chills.  Eyes: Negative for  visual disturbance.  Cardiovascular: Negative for chest pain.  Gastrointestinal: Negative for nausea.  Neurological: Positive for seizures. Negative for light-headedness and headaches.      Allergies  Review of patient's allergies indicates no known allergies.  Home Medications   Prior to Admission medications   Medication Sig Start Date End Date Taking? Authorizing Provider  LamoTRIgine (LAMICTAL XR) 250 MG TB24 Take 250 mg by mouth at bedtime.   Yes Historical Provider, MD  lisdexamfetamine (VYVANSE) 30 MG capsule Take 1 capsule (30 mg total) by mouth daily. 04/30/14  Yes Terressa Koyanagi, DO  pimecrolimus (ELIDEL) 1 % cream Apply 1 application topically 2 (two) times daily.   Yes Historical Provider, MD   BP 93/61  Pulse 112  Temp(Src) 99.4 F (37.4 C) (Oral)  Resp 20  SpO2 96% Physical Exam  Nursing note and vitals reviewed. Constitutional: He is oriented to person, place, and time. He appears well-developed and well-nourished.  Non-toxic appearance. He does not have a sickly appearance. He does not appear ill. No distress.  HENT:  Head: Normocephalic and atraumatic.  Eyes: EOM are normal. Pupils are equal, round, and reactive to light.  Neck: Normal range of motion. Neck supple.  Cardiovascular: Tachycardia present.   Pulses:      Radial pulses are 2+ on the right side, and 2+ on the left side.  110-120's on exam.  Pulmonary/Chest: Effort normal and breath sounds normal. No respiratory distress. He has no wheezes. He has no rales.  Abdominal: Soft. There is  no tenderness. There is no rebound.  Neurological: He is alert and oriented to person, place, and time. He has normal strength. He is not disoriented. No cranial nerve deficit or sensory deficit. GCS eye subscore is 4. GCS verbal subscore is 5. GCS motor subscore is 6.  Speech is clear and goal oriented, follows commands Cranial nerves III - XII grossly intact, no facial droop Normal strength in upper and lower  extremities bilaterally, strong and equal grip strength Sensation normal to light and sharp touch Moves all 4 extremities without ataxia, coordination intact. Normal heel-to-shin Normal finger to nose and rapid alternating movements No pronator drift   Skin: Skin is warm and dry. He is not diaphoretic.  Psychiatric: He has a normal mood and affect. His behavior is normal.    ED Course  Procedures (including critical care time) Labs Review Results for orders placed during the hospital encounter of 05/25/14  BASIC METABOLIC PANEL      Result Value Ref Range   Sodium 142  137 - 147 mEq/L   Potassium 4.9  3.7 - 5.3 mEq/L   Chloride 103  96 - 112 mEq/L   CO2 23  19 - 32 mEq/L   Glucose, Bld 90  70 - 99 mg/dL   BUN 10  6 - 23 mg/dL   Creatinine, Ser 6.960.85  0.50 - 1.35 mg/dL   Calcium 29.510.0  8.4 - 28.410.5 mg/dL   GFR calc non Af Amer >90  >90 mL/min   GFR calc Af Amer >90  >90 mL/min   Anion gap 16 (*) 5 - 15  CBC      Result Value Ref Range   WBC 4.3  4.0 - 10.5 K/uL   RBC 4.53  4.22 - 5.81 MIL/uL   Hemoglobin 14.0  13.0 - 17.0 g/dL   HCT 13.240.2  44.039.0 - 10.252.0 %   MCV 88.7  78.0 - 100.0 fL   MCH 30.9  26.0 - 34.0 pg   MCHC 34.8  30.0 - 36.0 g/dL   RDW 72.512.6  36.611.5 - 44.015.5 %   Platelets 231  150 - 400 K/uL  URINALYSIS, ROUTINE W REFLEX MICROSCOPIC      Result Value Ref Range   Color, Urine YELLOW  YELLOW   APPearance CLEAR  CLEAR   Specific Gravity, Urine 1.016  1.005 - 1.030   pH 5.5  5.0 - 8.0   Glucose, UA NEGATIVE  NEGATIVE mg/dL   Hgb urine dipstick NEGATIVE  NEGATIVE   Bilirubin Urine NEGATIVE  NEGATIVE   Ketones, ur NEGATIVE  NEGATIVE mg/dL   Protein, ur 347100 (*) NEGATIVE mg/dL   Urobilinogen, UA 0.2  0.0 - 1.0 mg/dL   Nitrite NEGATIVE  NEGATIVE   Leukocytes, UA NEGATIVE  NEGATIVE  URINE RAPID DRUG SCREEN (HOSP PERFORMED)      Result Value Ref Range   Opiates NONE DETECTED  NONE DETECTED   Cocaine NONE DETECTED  NONE DETECTED   Benzodiazepines NONE DETECTED  NONE DETECTED    Amphetamines POSITIVE (*) NONE DETECTED   Tetrahydrocannabinol NONE DETECTED  NONE DETECTED   Barbiturates NONE DETECTED  NONE DETECTED  URINE MICROSCOPIC-ADD ON      Result Value Ref Range   Squamous Epithelial / LPF FEW (*) RARE   WBC, UA 0-2  <3 WBC/hpf   Bacteria, UA FEW (*) RARE   Casts GRANULAR CAST (*) NEGATIVE  CBG MONITORING, ED      Result Value Ref Range   Glucose-Capillary 84  70 - 99 mg/dL   No results found.  Imaging Review No results found.   EKG Interpretation   Date/Time:  Friday May 25 2014 16:42:57 EDT Ventricular Rate:  100 PR Interval:  132 QRS Duration: 103 QT Interval:  319 QTC Calculation: 411 R Axis:   59 Text Interpretation:  Sinus tachycardia RSR' in V1 or V2, right VCD or RVH  ST elev, probable normal early repol pattern No old tracing to compare  Confirmed by CAMPOS  MD, Caryn Bee ([id number]) on 05/25/2014 5:51:42 PM      MDM   Final diagnoses:  Seizure   Patient presents after a witnessed seizure by family. Patient appears at baseline in ED, likely breakthrough seizure. Labs ordered. Work-up without acute cause of seizure. Discussed lab results, imaging results, and treatment plan with the patient and patient's parents. Advised follow up with neurologist. Return precautions given. Reports understanding and no other concerns at this time.  Patient is stable for discharge at this time. Meds given in ED:  Medications  LORazepam (ATIVAN) tablet 1 mg (1 mg Oral Given 05/25/14 1635)    New Prescriptions   No medications on file     Mellody Drown, PA-C 05/27/14 0047

## 2014-05-25 NOTE — Discharge Instructions (Signed)
Call for a follow up appointment with a Family or Primary Care Provider. Call your neurologist for further evaluation of your seizure.  Return if Symptoms worsen.   Take medication as prescribed.

## 2014-05-27 NOTE — ED Provider Notes (Signed)
Medical screening examination/treatment/procedure(s) were performed by non-physician practitioner and as supervising physician I was immediately available for consultation/collaboration.   EKG Interpretation   Date/Time:  Friday May 25 2014 16:42:57 EDT Ventricular Rate:  100 PR Interval:  132 QRS Duration: 103 QT Interval:  319 QTC Calculation: 411 R Axis:   59 Text Interpretation:  Sinus tachycardia RSR' in V1 or V2, right VCD or RVH  ST elev, probable normal early repol pattern No old tracing to compare  Confirmed by Wilna Pennie  MD, Markeda Narvaez ([id number]) on 05/25/2014 5:51:42 PM        Mark CoKevin M Lyndol Vanderheiden, MD 05/27/14 1110

## 2014-05-28 ENCOUNTER — Telehealth: Payer: Self-pay | Admitting: *Deleted

## 2014-05-28 ENCOUNTER — Telehealth: Payer: Self-pay | Admitting: Family Medicine

## 2014-05-28 NOTE — Telephone Encounter (Signed)
I called and gave her the ICD 9 code of 345.50. TG

## 2014-05-28 NOTE — Telephone Encounter (Addendum)
per mother called want our office to submit a compass referral  , pt scheduled for 06-01-2014@11 :6445  Called Dr Reyne Dumasheckling office  To get ICD code  transferred me to the nurse left a msg on vm  awaiting office to call back so I  can submitt referral to Dr. Deanna ArtisWilliam H. Sharene SkeansHickling, MD  Pediatrician  Address: [address], [place], [place] [address]  Phone: [phone]   [id number] referral done

## 2014-05-28 NOTE — Telephone Encounter (Signed)
Mark Nicholson, of Dr. Jodelle GreenKim Hannah's office, stated the pt is going to be seen on 06/01/14 with Dr. Sharene SkeansHickling. She stated their office need to know what the pt is coming in for and needs ICD-9 code. Mark KidneyDebra said the [facility] and needs make out a referral for his upcoming visit with Dr. Sharene SkeansHickling. She can be reached at [phone], ext. [phone].

## 2014-05-29 ENCOUNTER — Ambulatory Visit (INDEPENDENT_AMBULATORY_CARE_PROVIDER_SITE_OTHER): Payer: 59 | Admitting: Psychiatry

## 2014-05-29 ENCOUNTER — Encounter (HOSPITAL_COMMUNITY): Payer: Self-pay | Admitting: Psychiatry

## 2014-05-29 VITALS — BP 100/67 | HR 100 | Ht 68.0 in | Wt 133.0 lb

## 2014-05-29 DIAGNOSIS — F909 Attention-deficit hyperactivity disorder, unspecified type: Secondary | ICD-10-CM

## 2014-05-29 DIAGNOSIS — F908 Attention-deficit hyperactivity disorder, other type: Secondary | ICD-10-CM

## 2014-05-29 MED ORDER — LISDEXAMFETAMINE DIMESYLATE 30 MG PO CAPS: 30.0000 mg | ORAL_CAPSULE | Freq: Every day | ORAL | Status: DC

## 2014-05-29 NOTE — Progress Notes (Signed)
Psychiatric Assessment Child/Adolescent  Patient Identification:  AMANDA POTE Date of Evaluation:  05/29/2014  Chief Complaint:  ADHD History of Chief Complaint:  No chief complaint on file.   HPI Pt is 18 year old AAM, with h/o ADHD. Pt has h/o seizures, and is on lamotrigine 250 mg hs. 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 Pt is on vyvanse 30 mg po and is doing well on it. Sleeping and eating are better. He denies any depression, anxiety, mania, or psychosis. He denies SI/HI/AVH. Calm, cooperative, during interview.concentration is good. Noted to have white patches on his face, and is on a topical cream, see below. Rtc in 4 weeks.   Review of Systems Physical Exam   Mood Symptoms:  Concentration,  (Hypo) Manic Symptoms: Elevated Mood:  No Irritable Mood:  No Grandiosity:  No Distractibility:  Yes Labiality of Mood:  No Delusions:  No Hallucinations:  No Impulsivity:  Yes Sexually Inappropriate Behavior:  No Financial Extravagance:  No Flight of Ideas:  No  Anxiety Symptoms: Excessive Worry:  Yes Panic Symptoms:  No Agoraphobia:  No Obsessive Compulsive: No  Symptoms: None, Specific Phobias:  No Social Anxiety:  No  Psychotic Symptoms:  Hallucinations: No None Delusions:  No Paranoia:  No   Ideas of Reference:  No  PTSD Symptoms: Ever had a traumatic exposure:  No Had a traumatic exposure in the last month:  No Re-experiencing: No None Hypervigilance:  No Hyperarousal: No None Avoidance: No None  Traumatic Brain Injury: No   Past Psychiatric History: Diagnosis: ADHD  Hospitalizations:  no  Outpatient Care:  yes  Substance Abuse Care:  no  Self-Mutilation:  no  Suicidal Attempts:  no  Violent Behaviors:  no   Past Medical History:   Past Medical History  Diagnosis Date  . Physical growth delay   . Goiter   . Poor appetite    . ADHD (attention deficit hyperactivity disorder)   . Puberty delay   . Thyroiditis, autoimmune   . Seizures    History of Loss of Consciousness:  No Seizure History:  Yes Cardiac History:  No Allergies:  No Known Allergies Current Medications:  Current Outpatient Prescriptions  Medication Sig Dispense Refill  . LamoTRIgine (LAMICTAL XR) 250 MG TB24 Take 250 mg by mouth at bedtime.      Marland Kitchen lisdexamfetamine (VYVANSE) 30 MG capsule Take 1 capsule (30 mg total) by mouth daily.  30 capsule  0  . pimecrolimus (ELIDEL) 1 % cream Apply 1 application topically 2 (two) times daily.       No current facility-administered medications for this visit.    Previous Psychotropic Medications:  Medication Dose   see above                        Substance Abuse History in the last 12 months: none Substance Age of 1st Use Last Use Amount Specific Type  Nicotine      Alcohol      Cannabis      Opiates      Cocaine      Methamphetamines      LSD      Ecstasy      Benzodiazepines      Caffeine      Inhalants      Others:  Social History: Current Place of Residence: [place] Place of Birth:  12-Oct-1996 Family Members: parents, and sister, age 48  Children: na  Sons: na  Daughters: na Relationships: none  Developmental History: Physical and puberty delay Prenatal History:  Birth History:  Postnatal Infancy:  Developmental History:  Milestones:  Sit-Up:   Crawl:   Walk:   Speech:  School History:    Freshman year of college, going to [employer], will study Psychologist, sport and exercise History: The patient has no significant history of legal issues. Hobbies/Interests: computers   Family History:   Family History  Problem Relation Age of Onset  . Diabetes Paternal Aunt   . Thyroid disease Paternal Aunt   . Cancer Neg Hx   . Seizures Father 44    6-13 yo unresponsive staring, generalized clonic  . Lung cancer Maternal Grandfather   . Seizures Maternal  Grandfather     Related to alcohol use and sleep deprivation. treated w phenobarbital  . Alcohol abuse Maternal Grandfather   . Seizures Paternal Grandfather     Mental Status Examination/Evaluation: Objective:  Appearance: Casual and Guarded  Eye Contact::  Fair  Speech:  Slow  Volume:  Normal  Mood: euthymic  Affect:  Appropriate and Congruent  Thought Process:  Coherent  Orientation:  Full (Time, Place, and Person)  Thought Content:  WDL  Suicidal Thoughts:  No  Homicidal Thoughts:  No  Judgement:  Good  Insight:  Good  Psychomotor Activity:  Normal  Akathisia:  No  Handed:  Right  AIMS (if indicated): AIMS: Facial and Oral Movements Muscles of Facial Expression: None, normal Lips and Perioral Area: None, normal Jaw: None, normal Tongue: None, normal,Extremity Movements Upper (arms, wrists, hands, fingers): None, normal Lower (legs, knees, ankles, toes): None, normal, Trunk Movements Neck, shoulders, hips: None, normal, Overall Severity Severity of abnormal movements (highest score from questions above): None, normal Incapacitation due to abnormal movements: None, normal Patient's awareness of abnormal movements (rate only patient's report): No Awareness, Dental Status Current problems with teeth and/or dentures?: No Does patient usually wear dentures?: No  Assets:  Leisure Time Physical Health Resilience Social Support    Laboratory/X-Ray Psychological Evaluation(s)  NA  Dr. Marius Ditch   Assessment:   Axis I: ADHD, combined type Axis II: Deferred Axis III:  Past Medical History  Diagnosis Date  . Physical growth delay   . Goiter   . Poor appetite   . ADHD (attention deficit hyperactivity disorder)   . Puberty delay   . Thyroiditis, autoimmune   . Seizures    Axis IV: economic problems, educational problems, housing problems, occupational problems, other psychosocial or environmental problems, problems related to legal system/crime, problems  related to social environment, problems with access to health care services and problems with primary support group Axis V: 51-60 moderate symptoms  AXIS I ADHD, combined type  AXIS II Deferred  AXIS III Past Medical History  Diagnosis Date  . Physical growth delay   . Goiter   . Poor appetite   . ADHD (attention deficit hyperactivity disorder)   . Puberty delay   . Thyroiditis, autoimmune   . Seizures     AXIS IV economic problems, educational problems, housing problems, other psychosocial or environmental problems, problems related to legal system/crime, problems related to social environment, problems with access to health care services and problems with primary support group  AXIS V 51-60 moderate symptoms   Treatment Plan/Recommendations:Pt is 18 year old AAM, with h/o ADHD. Pt has  h/o seizures, and is on lamotrigine 250 mg hs. 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 Pt is on vyvanse 30 mg po and is doing well on it. Sleeping and eating are better. He denies any depression, anxiety, mania, or psychosis. He denies SI/HI/AVH. Calm, cooperative, during interview.concentration is good. Noted to have white patches on his face, and is on a topical cream, see below. Doing well, on Vyvanse. He wants to continue this medication. Rtc in 4 weeks.   Plan of Care: medication  Laboratory:  na  Psychotherapy:  no  Medications:  Vyvanse 30 mg po for ADHD  Routine PRN Medications:  No  Consultations:  As needed  Safety Concerns: no  Other:      Kendrick FriesBLANKMANN, Leia Coletti, NP 8/11/20153:19 PM

## 2014-06-01 ENCOUNTER — Ambulatory Visit (INDEPENDENT_AMBULATORY_CARE_PROVIDER_SITE_OTHER): Payer: 59 | Admitting: Pediatrics

## 2014-06-01 ENCOUNTER — Encounter: Payer: Self-pay | Admitting: Pediatrics

## 2014-06-01 VITALS — BP 102/60 | HR 72 | Ht 68.0 in | Wt 133.0 lb

## 2014-06-01 DIAGNOSIS — F902 Attention-deficit hyperactivity disorder, combined type: Secondary | ICD-10-CM

## 2014-06-01 DIAGNOSIS — G40309 Generalized idiopathic epilepsy and epileptic syndromes, not intractable, without status epilepticus: Secondary | ICD-10-CM

## 2014-06-01 DIAGNOSIS — G40209 Localization-related (focal) (partial) symptomatic epilepsy and epileptic syndromes with complex partial seizures, not intractable, without status epilepticus: Secondary | ICD-10-CM

## 2014-06-01 DIAGNOSIS — F909 Attention-deficit hyperactivity disorder, unspecified type: Secondary | ICD-10-CM

## 2014-06-01 MED ORDER — LAMOTRIGINE ER 50 MG PO TB24: ORAL_TABLET | ORAL | Status: DC

## 2014-06-01 MED ORDER — LAMICTAL XR 300 MG PO TB24: ORAL_TABLET | ORAL | Status: DC

## 2014-06-01 MED ORDER — LAMICTAL XR 50 MG PO TB24: ORAL_TABLET | ORAL | Status: DC

## 2014-06-01 MED ORDER — LAMOTRIGINE ER 300 MG PO TB24: ORAL_TABLET | ORAL | Status: DC

## 2014-06-01 NOTE — Patient Instructions (Addendum)
For the next month take a 50 mg and a 250 mg Lamictal extended release at nighttime.  After the 250 has been used up, switch to the 300 mg extended release at nighttime.  We discussed the need to sleep 8 hours a day, and to manage her timeline sleep.  You should avoid drinking alcohol.  You need to come up with a means to always remember to take your lamotrigine now that you have to depend solely on yourself remember.

## 2014-06-01 NOTE — Progress Notes (Addendum)
Patient: Mark Nicholson MRN: [id number] Sex: male DOB: Feb 21, 1996  Provider: Deetta Perla, MD Location of Care: [facility]  Note type: Routine return visit  History of Present Illness: Referral Source: Mark Basque, DO History from: mother, patient and [facility] chart Chief Complaint: Seizures/ADHD  [name] is a 18 y.o. male who returns for evaluation and management of seizures, and attention deficit hyperactivity disorder, combined type.  "Mark Nicholson" returns on June 01, 2014 for the first time since January 02, 2014.  On his last visit he was noted to have a recurrent seizure on April 10, 2013.  He has been seizure-free until May 25, 2013.  The day before he had been at a banquet in his honor for a scholarship.  He was up until two in the morning and got up around 10.  He was in the car with his father who was driving.  He left a dermatology appointment.  His father noted that he was extremely quiet.  He then had onset of rhythmic jerking of his extremities for three to four minutes.  He has taken Lamictal XR 250 mg and last had a drug level of 5.1 mcg/mL which is in the lower therapeutic range.  He will start at [employer] next week.  He has already moved into his dorm room.  He wants to major in Engineer, materials.  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  His general health has been good.  He has taken and tolerated Lamictal XR without side effects.  Review of Systems: 12 system review was remarkable for seizure  Past Medical History  Diagnosis Date  . Physical growth delay   . Goiter   . Poor appetite   . ADHD (attention deficit hyperactivity disorder)   . Puberty delay   . Thyroiditis, autoimmune   . Seizures    Hospitalizations: No., Head Injury: No., Nervous System Infections: No., Immunizations up to date: Yes.   Past Medical History  Syncope in the fifth grade which  caused him to be dazed, motor tic disorder-class in, short stature, delayed puberty, autoimmune thyroiditis with goiter, attention deficit hyperactivity disorder mixed type, osteochondritis dissecans, Lichen striatus.  He is followed by Dr. Molli Knock. The patient also vitiligo on his face.  He had a single generalized tonic-clonic seizure on September 22, 2010.  EEG performed on October 02, 2010 showed brief generalized electrographic seizures of four seconds in duration without clinical accompaniments.  EEG on September 27, 2012 was normal.  Seen in the ER at [facility] due to a seizure on 05/25/14.  Birth History 8 lbs. 1 oz. infant born at [redacted] weeks gestational age tube a 18 year old primigravida.  Gestation complicated by a 27 pound weight gain.  Labor lasted for only one hour. Normal spontaneous vaginal delivery. Nursery course was uneventful. Breast feeding over 11 months.  Growth and development was recalled as normal. Nocturnal enuresis until 18 years of age.  Behavior History none  Surgical History Past Surgical History  Procedure Laterality Date  . Circumcision      Family History family history includes Alcohol abuse in his maternal grandfather; Diabetes in his paternal aunt; Lung cancer in his maternal grandfather; Seizures in his maternal grandfather and paternal grandfather; Seizures (age of onset: 71) in his father; Thyroid disease in his paternal aunt. There is no history of Cancer. Family history is negative for migraines, intellectual disabilities, blindness, deafness, birth defects, chromosomal disorder, or  autism.  Social History History   Social History  . Marital Status: Single    Spouse Name: N/A    Number of Children: N/A  . Years of Education: N/A   Social History Main Topics  . Smoking status: Never Smoker   . Smokeless tobacco: Never Used  . Alcohol Use: No  . Drug Use: No  . Sexual Activity: No   Other Topics Concern  . 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   Has been accepted to [employer], on waiting list at [employer]. 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         Educational level 12th grade School Attending: [employer]  Occupation: Student  Living with both parents and sister  Hobbies/Interest: playing video games School comments Mark Nicholson graduated from [employer] in June 2015. He will be attending college in the fall.  Current Outpatient Prescriptions on File Prior to Visit  Medication Sig Dispense Refill  . LamoTRIgine (LAMICTAL XR) 250 MG TB24 Take 250 mg by mouth at bedtime.      Marland Kitchen lisdexamfetamine (VYVANSE) 30 MG capsule Take 1 capsule (30 mg total) by mouth daily.  30 capsule  0  . pimecrolimus (ELIDEL) 1 % cream Apply 1 application topically 2 (two) times daily.       No current facility-administered medications on file prior to visit.   The medication list was reviewed and reconciled. All changes or newly prescribed medications were explained.  A complete medication list was provided to the patient/caregiver.  No Known Allergies  Physical Exam BP 102/60  Pulse 72  Ht 5\' 8"  (1.727 m)  Wt 133 lb (60.328 kg)  BMI 20.23 kg/m2  General: alert, well developed, well nourished, in no acute distress, black hair, brown eyes, left handed  Head: normocephalic, no dysmorphic features  Ears, Nose and Throat: Otoscopic: Tympanic membranes normal. Pharynx: oropharynx is pink without exudates or tonsillar hypertrophy.  Neck: supple, full range of motion, no cranial or cervical bruits  Respiratory: auscultation clear  Cardiovascular: no murmurs, pulses are normal  Musculoskeletal: no skeletal deformities or apparent scoliosis  Skin: Vitiligo on his face, no neurocutaneous lesions   Neurologic Exam   Mental Status: alert; oriented to person, place  and year; knowledge is normal for age; language is normal  Cranial Nerves: visual fields are full to double simultaneous stimuli; extraocular movements are full and conjugate; pupils are around reactive to light; funduscopic examination shows sharp disc margins with normal vessels; symmetric facial strength; midline tongue and uvula; air conduction is greater than bone conduction bilaterally.  Motor: Normal strength, tone and mass; good fine motor movements; no pronator drift.  Sensory: intact responses to cold, vibration, proprioception and stereognosis  Coordination: good finger-to-nose, rapid repetitive alternating movements and finger apposition  Gait and Station: normal gait and station: patient is able to walk on heels, toes and tandem without difficulty; balance is adequate; Romberg exam is negative; Gower response is negative  Reflexes: symmetric and diminished bilaterally; no clonus; bilateral flexor plantar responses.  Assessment 1. Generalized convulsive epilepsy without mention of intractable epilepsy, 345.10. 2. 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 3. Attention deficit disorder, combined type, 314.01.  Discussion I don't know why he had a recurrent seizure.  His drug levels  are in the lower therapeutic range.  Plan We will increase Lamictal XR to 300 mg a day.  Since he has a new prescription of 250 mg to give him a bridge of 50 mg XR tablets to be used concurrently with the 250s.  Next month he will start on the 300 mg tablet.  He will restart Vyvanse when he returns to school.  I discussed the conditions that would lower his seizure threshold which includes sleep deprivation, alcohol, and noncompliance with medical regimen.  I made recommendations in all those areas.  I will see him in follow-up in four months.  I spent 30 minutes of face-to-face time with Tinnie GensJeffrey and his mother, more than half of it in  consultation.  We discussed driving.  I would not recommend allowing him to drive for at least a month.  He is under learner's permit status.  He will not be able to successfully obtain a permanent provisional or permanent license until he has been seizure-free for a year.  Indeed if we were to discuss this with Department of Motor Vehicles, they would request the surrender his license until he had been seizure-free for six months.  Deetta PerlaWilliam H Hickling MD

## 2014-06-04 ENCOUNTER — Telehealth: Payer: Self-pay | Admitting: Family Medicine

## 2014-06-04 NOTE — Telephone Encounter (Signed)
Yes injections. Thanks!

## 2014-06-04 NOTE — Telephone Encounter (Signed)
Pt needs referral [place] [facility] for  light box.  This is ongoing.

## 2014-06-04 NOTE — Telephone Encounter (Signed)
Need clarification is this for his injection  Shots ?? For his Vitiligo   What is a light box  ?

## 2014-06-04 NOTE — Telephone Encounter (Signed)
Done  Authorization # [id number] [facility]: Rocco SereneJones Drew A MD  Doctor  Address: [address], [place], [place] [address]  [phone]

## 2014-06-16 ENCOUNTER — Other Ambulatory Visit: Payer: Self-pay | Admitting: Pediatrics

## 2014-06-28 ENCOUNTER — Encounter (HOSPITAL_COMMUNITY): Payer: Self-pay | Admitting: Psychiatry

## 2014-06-28 ENCOUNTER — Ambulatory Visit (INDEPENDENT_AMBULATORY_CARE_PROVIDER_SITE_OTHER): Payer: 59 | Admitting: Psychiatry

## 2014-06-28 VITALS — BP 103/63 | HR 100 | Ht 67.5 in | Wt 135.0 lb

## 2014-06-28 DIAGNOSIS — F908 Attention-deficit hyperactivity disorder, other type: Secondary | ICD-10-CM

## 2014-06-28 DIAGNOSIS — F909 Attention-deficit hyperactivity disorder, unspecified type: Secondary | ICD-10-CM

## 2014-06-28 DIAGNOSIS — G40309 Generalized idiopathic epilepsy and epileptic syndromes, not intractable, without status epilepticus: Secondary | ICD-10-CM

## 2014-06-28 MED ORDER — LISDEXAMFETAMINE DIMESYLATE 30 MG PO CAPS: 30.0000 mg | ORAL_CAPSULE | Freq: Every day | ORAL | Status: DC

## 2014-06-28 NOTE — Progress Notes (Signed)
   [facility] Follow-up Outpatient Visit  Mark Nicholson Aug 23, 1996  Date:  06/28/14  Subjective:  Pt is here for follow up. He's in 9th grade, and school is going well. He denies Si/hi/avh. Tolerating the medications: lamicatal XR 300 mg po daily for epilepsy and vyvanse 30 mg po for ADHD. Concentration is good. 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 He has elidel 1 % cream for his face. Calm and cooperative. Euthymic. Rtc in 3 months.  There were no vitals filed for this visit.  Mental Status Examination  Appearance: casual  Alert: Yes Attention: fair  Cooperative: Yes Eye Contact: Fair Speech: wdl  Psychomotor Activity: Normal Memory/Concentration: good Oriented: time/date Mood: Euthymic Affect: Appropriate and Congruent Thought Processes and Associations: Linear and Logical Fund of Knowledge: Fair Thought Content: preoccupations Insight: Good Judgement: Good  Diagnosis:  Adhd  Treatment Plan:  Rtc in 3 months Lamictal XR 300 mg po for mood Vyvanse 30 mg po daily for ADHD  Kendrick Fries, NP

## 2014-07-03 NOTE — Telephone Encounter (Signed)
Authorization # Your referral case information was transmitted on 07/03/2014 at 12:56 PM CDT Your Referral Number is [id number] [facility]: Rocco Serene MD  Doctor  Address: [address] [place], [place], [place] [address]  [phone]

## 2014-07-03 NOTE — Telephone Encounter (Signed)
Pt needs that referral again. Can you put in? Same as before. Thanks!

## 2014-07-11 ENCOUNTER — Telehealth: Payer: Self-pay | Admitting: Family Medicine

## 2014-07-11 NOTE — Telephone Encounter (Signed)
done

## 2014-07-11 NOTE — Telephone Encounter (Signed)
Mom states pt was supposed to have a referral for his visit on 8/14  W/ dr hickling. Mom had called but referral was not done. Or the dr's office did not get it . Pt has [employer] compass. pls advise.

## 2014-07-18 ENCOUNTER — Telehealth: Payer: Self-pay | Admitting: Family Medicine

## 2014-07-18 NOTE — Telephone Encounter (Signed)
Pt needs referral [place] [facility] for his ongoing Vitiligo

## 2014-07-19 NOTE — Telephone Encounter (Signed)
Confirmation Thank you for your online Referral submission. Your referral case information was transmitted on 07/19/2014 at 02:19 PM CDT Your Referral Number is [id number]

## 2014-08-01 ENCOUNTER — Telehealth: Payer: Self-pay | Admitting: Family Medicine

## 2014-08-01 NOTE — Telephone Encounter (Signed)
Confirmation Thank you for your online Referral submission. Your referral case information was transmitted on 08/01/2014 at 02:39 PM CDT Your Referral Number is [id number] FAXED TO [phone] [facility]: Rocco SereneJones Drew A MD   Address: [address], [place], [place] [address]  [phone]

## 2014-08-01 NOTE — Telephone Encounter (Signed)
Mark KidneyDebra pt needs an referral to dr drew [facility] [place] [facility] for lighbox treatment. Pt has [facility]

## 2014-08-03 ENCOUNTER — Ambulatory Visit (INDEPENDENT_AMBULATORY_CARE_PROVIDER_SITE_OTHER)
Admission: RE | Admit: 2014-08-03 | Discharge: 2014-08-03 | Disposition: A | Discharge status: Home / Self Care | Payer: 59 | Source: Ambulatory Visit | Attending: Physician Assistant | Admitting: Physician Assistant

## 2014-08-03 ENCOUNTER — Ambulatory Visit: Payer: Self-pay | Admitting: Family Medicine

## 2014-08-03 ENCOUNTER — Encounter: Payer: Self-pay | Admitting: Physician Assistant

## 2014-08-03 ENCOUNTER — Ambulatory Visit (INDEPENDENT_AMBULATORY_CARE_PROVIDER_SITE_OTHER): Payer: 59 | Admitting: Physician Assistant

## 2014-08-03 VITALS — BP 90/60 | HR 60 | Temp 98.4°F | Resp 18 | Wt 136.6 lb

## 2014-08-03 DIAGNOSIS — S6992XA Unspecified injury of left wrist, hand and finger(s), initial encounter: Secondary | ICD-10-CM

## 2014-08-03 NOTE — Patient Instructions (Addendum)
You will have an Xray today of your wrist to evaluate any breaks.  We will call you with the results of this when available.  Continue RICE therapy as discussed (rest, ice, compression, elevation).  You can continue taking either ibuprofen or aleve to help the pain.  Push fluids.  If emergency symptoms discussed during visit developed, seek medical attention immediately.  Followup next week with PCP to reassess, or for worsening or persistent symptoms despite treatment.     RICE: Routine Care for Injuries Rest, Ice, Compression, and Elevation (RICE) are often used to care for injuries. HOME CARE  Rest your injury.  Put ice on the injury.  Put ice in a plastic bag.  Place a towel between your skin and the bag.  Leave the ice on for 15-20 minutes, 03-04 times a day. Do this for as long as told by your doctor.  Apply pressure (compression) with an elastic bandage. Remove and reapply the bandage every 3 to 4 hours. Do not wrap the bandage too tight. Wrap the bandage looser if the fingers or toes are puffy (swollen), blue, cold, painful, or lose feeling (numb).  Raise (elevate) your injury. Raise your injury above the heart if you can. GET HELP RIGHT AWAY IF:  You have lasting pain or puffiness.  Your injury is red, weak, or loses feeling.  Your problems get worse, not better, after several days. MAKE SURE YOU:  Understand these instructions.  Will watch your condition.  Will get help right away if you are not doing well or get worse. Document Released: 03/23/2008 Document Revised: 12/28/2011 Document Reviewed: 03/06/2011 [facility] Patient Information 2015 [place]. This information is not intended to replace advice given to you by your health care provider. Make sure you discuss any questions you have with your health care provider.

## 2014-08-03 NOTE — Progress Notes (Signed)
Subjective:    Patient ID: Mark Nicholson, male    DOB: 07-09-1996, 18 y.o.   MRN: [id number]  Wrist Injury  The incident occurred 3 to 5 days ago (4 days). The incident occurred at home. The injury mechanism was a fall (fall on outstretched hand). The pain is present in the left wrist. The quality of the pain is described as aching. The pain does not radiate. The pain is at a severity of 6/10. The pain is moderate. The pain has been fluctuating (only with movement.) since the incident. Pertinent negatives include no chest pain, muscle weakness, numbness or tingling. Nothing aggravates the symptoms. He has tried immobilization, ice, NSAIDs and rest for the symptoms. The treatment provided mild relief.      Review of Systems  Constitutional: Negative for fever and chills.  Respiratory: Negative for shortness of breath.   Cardiovascular: Negative for chest pain.  Gastrointestinal: Negative for nausea, vomiting and diarrhea.  Musculoskeletal: Positive for joint swelling.  Neurological: Negative for tingling, syncope and numbness.  All other systems reviewed and are negative.    Past Medical History  Diagnosis Date  . Physical growth delay   . Goiter   . Poor appetite   . ADHD (attention deficit hyperactivity disorder)   . Puberty delay   . Thyroiditis, autoimmune   . Seizures     History   Social History  . Marital Status: Single    Spouse Name: N/A    Number of Children: N/A  . Years of Education: N/A   Occupational History  . Not on file.   Social History Main Topics  . Smoking status: Never Smoker   . Smokeless tobacco: Never Used  . Alcohol Use: No  . Drug Use: No  . Sexual Activity: No   Other Topics Concern  . 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   Has been accepted to [employer], on waiting list at [employer].  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          Past Surgical History  Procedure Laterality Date  . Circumcision      Family History  Problem Relation Age of Onset  . Diabetes Paternal Aunt   . Thyroid disease Paternal Aunt   . Cancer Neg Hx   . Seizures Father 646    6-13 yo unresponsive staring, generalized clonic  . Lung cancer Maternal Grandfather   . Seizures Maternal Grandfather     Related to alcohol use and sleep deprivation. treated w phenobarbital  . Alcohol abuse Maternal Grandfather   . Seizures Paternal Grandfather     No Known Allergies  Current Outpatient Prescriptions on File Prior to Visit  Medication Sig Dispense Refill  . LAMICTAL XR 300 MG TB24 In one month take one tablet at nighttime.  31 tablet  5  . lisdexamfetamine (VYVANSE) 30 MG capsule Take 1 capsule (30 mg total) by mouth daily.  30 capsule  0  . pimecrolimus (ELIDEL) 1 % cream Apply 1 application topically 2 (two) times daily.       No current facility-administered medications on file prior to visit.    EXAM: BP 90/60  Pulse 60  Temp(Src) 98.4 F (36.9 C) (Oral)  Resp 18  Wt 136 lb 9.6 oz (61.961 kg)     Objective:  Physical Exam  Nursing note and vitals reviewed. Constitutional: He is oriented to person, place, and time. He appears well-developed and well-nourished. No distress.  HENT:  Head: Normocephalic and atraumatic.  Eyes: Conjunctivae and EOM are normal.  Neck: Normal range of motion.  Cardiovascular: Normal rate, regular rhythm and intact distal pulses.   Pulmonary/Chest: Effort normal and breath sounds normal. No respiratory distress. He exhibits no tenderness.  Musculoskeletal: Normal range of motion.  Mild swelling to the left wrist. No ttp of the left wrist or forearm or elbow. NROM of the bilateral wrist, elbow, and shoulders. ROM mildly painful in the left wrist in all directions.  Neurological: He is alert and oriented to person, place, and time.  Strength  and sensation intact bilaterally.  Skin: Skin is warm and dry. He is not diaphoretic. No pallor.  Psychiatric: He has a normal mood and affect. His behavior is normal. Judgment and thought content normal.    Lab Results  Component Value Date   WBC 4.3 05/25/2014   HGB 14.0 05/25/2014   HCT 40.2 05/25/2014   PLT 231 05/25/2014   GLUCOSE 90 05/25/2014   NA 142 05/25/2014   K 4.9 05/25/2014   CL 103 05/25/2014   CREATININE 0.85 05/25/2014   BUN 10 05/25/2014   CO2 23 05/25/2014   TSH 1.060 02/12/2014         Assessment & Plan:  Mark Nicholson was seen today for left wrist pain.  Diagnoses and associated orders for this visit:  Wrist injury, left, initial encounter Comments: FOOSH, xray today. Continue RICE therapy. NSAID. watchful waiting. - DG Wrist Complete Left; Future - DG Elbow Complete Left; Future    Return precautions provided, and patient handout on RICE.  Plan to follow up in 1 week with PCP to reassess, or for worsening or persistent symptoms despite treatment.  Patient Instructions  You will have an Xray today of your wrist to evaluate any breaks.  We will call you with the results of this when available.  Continue RICE therapy as discussed (rest, ice, compression, elevation).  You can continue taking either ibuprofen or aleve to help the pain.  Push fluids.  If emergency symptoms discussed during visit developed, seek medical attention immediately.  Followup next week with PCP to reassess, or for worsening or persistent symptoms despite treatment.

## 2014-08-03 NOTE — Progress Notes (Signed)
Pre visit review using our clinic review tool, if applicable. No additional management support is needed unless otherwise documented below in the visit note. 

## 2014-08-13 ENCOUNTER — Telehealth: Payer: Self-pay | Admitting: Family Medicine

## 2014-08-13 ENCOUNTER — Encounter: Payer: Self-pay | Admitting: "Endocrinology

## 2014-08-13 ENCOUNTER — Ambulatory Visit (INDEPENDENT_AMBULATORY_CARE_PROVIDER_SITE_OTHER): Payer: 59 | Admitting: "Endocrinology

## 2014-08-13 VITALS — BP 109/67 | HR 87 | Wt 134.2 lb

## 2014-08-13 DIAGNOSIS — L80 Vitiligo: Secondary | ICD-10-CM

## 2014-08-13 DIAGNOSIS — N62 Hypertrophy of breast: Secondary | ICD-10-CM

## 2014-08-13 DIAGNOSIS — R6252 Short stature (child): Secondary | ICD-10-CM

## 2014-08-13 DIAGNOSIS — E063 Autoimmune thyroiditis: Secondary | ICD-10-CM

## 2014-08-13 DIAGNOSIS — E049 Nontoxic goiter, unspecified: Secondary | ICD-10-CM

## 2014-08-13 NOTE — Telephone Encounter (Signed)
Pt's mom states pt needs a new referral for Dr. Fransico MichaelBrennan. Pt is being seen today.

## 2014-08-13 NOTE — Patient Instructions (Signed)
Follow up visit in 6 months. Please have lab tests drawn before next visit.

## 2014-08-13 NOTE — Progress Notes (Signed)
Subjective:  Patient Name: Artem Bunte Date of Birth: [birth year]  MRN: [id number]  Destyn Schuyler  presents to the office today for follow-up evaluation and management of his delayed puberty, poor weight gain, growth delay, gynecomastia, vitiligo, and short stature.  HISTORY OF PRESENT ILLNESS:   [name] is a 18 y.o. African-American young man.  [name] was unaccompanied.  1.  [name] was first seen in our clinic on 01/28/09 after referral by his primary care provider, Dr. Randell Loop of [facility] for evaluation and management of growth delay in association with taking medication for ADHD. The patient was then 13-1/2.  He reportedly had previously been at about the 50% on both the height growth curve and weight growth curve. His appetite decreased markedly on ADD medications. His growth fell off as well. The patient was only hungry at the very end of the day. He was started on cyproheptadine and liberalization of his diet, and responded well. He did try stopping the cyproheptadine but his weight fell.  He also had a delayed start to puberty secondary to poor weight gain poor growth. Since neither he nor his mother wanted him to take cyproheptadine, I agreed in May 2014 to stop the cyproheptadine as long as he continued to eat and to grow. His appetite did increase later and his growth in weight and height increased accordingly.  2. [name]'s last [facility] visit was on 02/12/14.   A. In the interim, he has been pretty healthy. His appetite is pretty good. He feels that his vitiligo is improving. He has not had any further seizures since his last visit. He remains on Lamictal and  Vyvanse.  B. He has been followed for pubertal delay. Pubic hair, axillary hair, and genitalia are all increasing in amount/size.    3. Pertinent Review of Systems:  Constitutional: The patient feels "good". He seems healthy and active. Eyes: Vision seems to be good with his current eye glasses or contacts.  There are no recognized eye problems. Neck: The patient has no complaints of anterior neck swelling, soreness, tenderness, pressure, discomfort, or difficulty swallowing.  Heart: Heart rate increases with exercise or other physical activity. The patient has no complaints of palpitations, irregular heart beats, chest pain, or chest pressure.   Gastrointestinal: Bowel movents seem normal. The patient has no complaints of excessive hunger, acid reflux, upset stomach, stomach aches or pains, diarrhea, or constipation.  Legs: Muscle mass and strength seem normal. There are no complaints of numbness, tingling, burning, or pain. No edema is noted.  Feet: There are no obvious foot problems. There are no complaints of numbness, tingling, burning, or pain. No edema is noted. Neurologic: There are no recognized problems with muscle movement and strength, sensation, or coordination. Chest:  Breast are flat now.  Skin: He feels that his vitiligo is better after beginning UV light therapy at [facility]. He has the UV therapy 3 times per week.    PAST MEDICAL, FAMILY, AND SOCIAL HISTORY  Past Medical History  Diagnosis Date  . Physical growth delay   . Goiter   . Poor appetite   . ADHD (attention deficit hyperactivity disorder)   . Puberty delay   . Thyroiditis, autoimmune   . Seizures     Family History  Problem Relation Age of Onset  . Diabetes Paternal Aunt   . Thyroid disease Paternal Aunt   . Cancer Neg Hx   . Seizures Father 59    6-13 yo unresponsive staring, generalized clonic  .  Lung cancer Maternal Grandfather   . Seizures Maternal Grandfather     Related to alcohol use and sleep deprivation. treated w phenobarbital  . Alcohol abuse Maternal Grandfather   . Seizures Paternal Grandfather     Current outpatient prescriptions:LAMICTAL XR 300 MG TB24, In one month take one tablet at nighttime., Disp: 31 tablet, Rfl: 5;  lisdexamfetamine (VYVANSE) 30 MG capsule, Take 1 capsule  (30 mg total) by mouth daily., Disp: 30 capsule, Rfl: 0;  pimecrolimus (ELIDEL) 1 % cream, Apply 1 application topically 2 (two) times daily., Disp: , Rfl:   Allergies as of 08/13/2014  . (No Known Allergies)     reports that he has never smoked. He has never used smokeless tobacco. He reports that he does not drink alcohol or use illicit drugs. Pediatric History  Patient Guardian Status  . Mother:  Christean LeafMeadows,[name]   Other Topics Concern  . 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   Has been accepted to [employer], on waiting list at [employer]. 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        1. 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 2. Activities: He walks around campus a lot. He sometimes plays low-intensity football with friends.    3. Primary Care Provider: Terressa KoyanagiKIM, HANNAH R., DO, [facility], [place]  REVIEW OF SYSTEMS: There are no other significant problems involving [name]'s other body systems.   Objective:  Vital Signs:  BP 109/67  Pulse 87  Wt 134 lb 3.2 oz (60.873 kg)   Ht Readings from Last 3 Encounters:  06/28/14 5' 7.5" (1.715 m) (24%*, Z = -0.70)  06/01/14 5\' 8"  (1.727 m) (30%*, Z = -0.53)  05/29/14 5\' 8"  (1.727 m) (30%*, Z = -0.53)   * Growth percentiles are based on CDC 2-20 Years data.   Wt Readings from Last 3 Encounters:  08/13/14 134 lb 3.2 oz (60.873 kg) (21%*, Z = -0.81)  08/03/14 136 lb 9.6 oz (61.961 kg) (25%*, Z = -0.69)  06/28/14 135 lb (61.236 kg) (23%*, Z = -0.75)   * Growth percentiles are based on CDC 2-20 Years data.   HC Readings from Last 3 Encounters:  [facility] for [facility]   There is no height on file to calculate BSA. No height on file for this encounter. 21%ile (Z=-0.81) based on CDC 2-20 Years weight-for-age data.    PHYSICAL  EXAM:  Constitutional: The patient appears healthy, but slender. His height is at the 24%. His weight is at the 21%. His height appears to have plateaued.  Head: The head is normocephalic. Face: His vitiligo is better. He has more pink areas and brown areas and fewer white areas. The pigment is slowly returning toward normal.  Eyes: The eyes appear to be normally formed and spaced. Gaze is conjugate. There is no obvious arcus or proptosis. Moisture appears normal. Ears: The ears are normally placed and appear externally normal. Mouth: The oropharynx and tongue appear normal. Dentition appears to be normal for age. Oral moisture is normal. Neck: The neck appears to be visibly normal. No carotid bruits are noted. The thyroid gland is larger at about 23-24 grams in size. Both lobes are enlarged, the left much larger than the right. The consistency of the  thyroid gland is relatively "boggy". The thyroid gland is not tender to palpation. He has +1 acanthosis. Lungs: The lungs are clear to auscultation. Air movement is good. Heart: Heart rate and rhythm are regular. Heart sounds S1 and S2 are normal. I did not appreciate any pathologic cardiac murmurs. Abdomen: The abdomen is normal in size for the patient's age. Bowel sounds are normal. There is no obvious hepatomegaly, splenomegaly, or other mass effect.  Arms: Muscle size and bulk are normal for age. Hands: There is no obvious tremor. Phalangeal and metacarpophalangeal joints are normal. Palmar muscles are normal for age. Palmar skin is normal. Palmar moisture is also normal. Legs: Muscles appear normal for age. No edema is present. Neurologic: Strength is normal for age in both the upper and lower extremities. Muscle tone is normal. Sensation to touch is normal in both legs.   Breasts: Tanner stage I. Breast buds are not palpable. Right areola measures 25 mm, compared with 26 mm.  Left areola measures 22 mm, compared with 29 mm at last visit.   LAB  DATA:  02/12/14: TSH 1.060, Free T4 1.13, free T3 3.4  07/19/13: TSH 0.756, free T4 1.19, free T3 3.6  02/13/13: TSH 1.333, free T4 0.98, free T3 3.2  10/03/12: TSH 1.285, free T4 1.34, free T3 3.5   12/28/11: TSH 2.154, free T4 1.18, free T3 3.6  IMAGING: Bone age on 02/20/13: BA 14 at CA 17. I reviewed the study. The BA was actually 14.5 years.   Assessment and Plan:   ASSESSMENT:  1. 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   2. Gynecomastia: His breast buds have disappeared. His areolae are flat. His gynecomastia has essentially resolved. We do not need to follow this problem any longer.     3. Pubertal delay: His puberty has progressed nicely.   4. Decreased appetite/weight loss: These problems have resolved. 5. Vitiligo: The vitiligo seems to be improving.  6. Goiter: His thyroid gland is larger and softer today. These findings imply less inflammation by Hashimoto's disease WBCs, but could also occur if the TSIs are higher. He was euthyroid in April. He will probably become hypothyroid in the next 5 years.  His TFTs have been mid-range normal since December 2013. He is clinically euthyroid today.  7. Hashimoto's Disease: The presence of positive antibodies in the setting of a goiter that waxes and wanes and his positive family history all suggest that Reuel BoomDaniel will eventually develop hypothyroidism. At present, however, the thyroiditis is clinically quiescent.  PLAN:  1. Diagnostic: TFTs, LH, FSH, and testosterone prior to next visit.  2. Therapeutic: Eat normally.  Try to fit in more exercise. 3. Patient education: Discussed gynecomastia, growth, vitiligo, and autoimmune diseases.  4. Follow-up: 6 months   Level of Service: This visit lasted in excess of 40 minutes. More than 50% of the visit was devoted to counseling.  David StallBRENNAN,MICHAEL J, MD

## 2014-08-15 NOTE — Telephone Encounter (Signed)
Pt scheduled for 02/18/2015 will do referral  Then because it will expire before date

## 2014-08-16 NOTE — Telephone Encounter (Signed)
Done  [facility]: David StallBrennan Michael J MD  Pediatrician  Address: [address], [place], [place] [address]  [phone] Confirmation  Thank you for your online Referral submission. Your referral case information was transmitted on 08/16/2014 at 01:24 PM CDT Your Referral Number is [id number]

## 2014-08-16 NOTE — Telephone Encounter (Signed)
Mom calling to confirm referral had been done for mon 10/26 appt.  pls advise.

## 2014-09-03 ENCOUNTER — Telehealth: Payer: Self-pay | Admitting: Family Medicine

## 2014-09-03 NOTE — Telephone Encounter (Signed)
Pt needs referral to dr Pester. Dermatologist for the lightbox

## 2014-09-03 NOTE — Telephone Encounter (Signed)
Thank you for your online Referral submission. Your referral case information was transmitted on 09/03/2014 at 01:37 PM CST Your Referral Number is [id number]

## 2014-09-07 ENCOUNTER — Encounter (HOSPITAL_COMMUNITY): Payer: Self-pay | Admitting: Emergency Medicine

## 2014-09-07 ENCOUNTER — Emergency Department (HOSPITAL_COMMUNITY)
Admission: EM | Admit: 2014-09-07 | Discharge: 2014-09-08 | Disposition: A | Discharge status: Home / Self Care | Payer: 59 | Attending: Emergency Medicine | Admitting: Emergency Medicine

## 2014-09-07 DIAGNOSIS — R569 Unspecified convulsions: Secondary | ICD-10-CM | POA: Diagnosis present

## 2014-09-07 DIAGNOSIS — F909 Attention-deficit hyperactivity disorder, unspecified type: Secondary | ICD-10-CM | POA: Insufficient documentation

## 2014-09-07 DIAGNOSIS — Z79899 Other long term (current) drug therapy: Secondary | ICD-10-CM | POA: Diagnosis not present

## 2014-09-07 DIAGNOSIS — Z8639 Personal history of other endocrine, nutritional and metabolic disease: Secondary | ICD-10-CM | POA: Insufficient documentation

## 2014-09-07 DIAGNOSIS — Z8739 Personal history of other diseases of the musculoskeletal system and connective tissue: Secondary | ICD-10-CM | POA: Insufficient documentation

## 2014-09-07 LAB — CBG MONITORING, ED: Glucose-Capillary: 83 mg/dL (ref 70–99)

## 2014-09-07 LAB — CBC WITH DIFFERENTIAL/PLATELET
Basophils Absolute: 0 10*3/uL (ref 0.0–0.1)
Basophils Relative: 0 % (ref 0–1)
Eosinophils Absolute: 0.2 10*3/uL (ref 0.0–0.7)
Eosinophils Relative: 4 % (ref 0–5)
HCT: 36.3 % — ABNORMAL LOW (ref 39.0–52.0)
Hemoglobin: 12.7 g/dL — ABNORMAL LOW (ref 13.0–17.0)
LYMPHS ABS: 2.5 10*3/uL (ref 0.7–4.0)
LYMPHS PCT: 52 % — AB (ref 12–46)
MCH: 30.8 pg (ref 26.0–34.0)
MCHC: 35 g/dL (ref 30.0–36.0)
MCV: 87.9 fL (ref 78.0–100.0)
Monocytes Absolute: 0.4 10*3/uL (ref 0.1–1.0)
Monocytes Relative: 8 % (ref 3–12)
NEUTROS PCT: 36 % — AB (ref 43–77)
Neutro Abs: 1.7 10*3/uL (ref 1.7–7.7)
Platelets: 206 10*3/uL (ref 150–400)
RBC: 4.13 MIL/uL — AB (ref 4.22–5.81)
RDW: 12.9 % (ref 11.5–15.5)
WBC: 4.8 10*3/uL (ref 4.0–10.5)

## 2014-09-07 LAB — I-STAT CHEM 8, ED
BUN: 11 mg/dL (ref 6–23)
CREATININE: 0.8 mg/dL (ref 0.50–1.35)
Calcium, Ion: 1.2 mmol/L (ref 1.12–1.23)
Chloride: 102 mEq/L (ref 96–112)
Glucose, Bld: 79 mg/dL (ref 70–99)
HCT: 39 % (ref 39.0–52.0)
HEMOGLOBIN: 13.3 g/dL (ref 13.0–17.0)
POTASSIUM: 4.4 meq/L (ref 3.7–5.3)
SODIUM: 141 meq/L (ref 137–147)
TCO2: 21 mmol/L (ref 0–100)

## 2014-09-07 MED ORDER — LORAZEPAM 2 MG/ML IJ SOLN
1.0000 mg | Freq: Once | INTRAMUSCULAR | Status: AC
  Administered 2014-09-07: 1 mg via INTRAVENOUS
  Filled 2014-09-07: qty 1

## 2014-09-07 NOTE — ED Notes (Signed)
To ED from campus via [facility], witnessed seizure pta, no oral trauma or incontinence, post ictal on EMS arrival, alert on ED arrival,   CBG 99 HR 125

## 2014-09-07 NOTE — ED Provider Notes (Signed)
CSN: [id number]     Arrival date & time 09/07/14  2301 History   First MD Initiated Contact with Patient 09/07/14 2309     Chief Complaint  Patient presents with  . 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  He states he has one breakthrough seizure year.  Today was the day was witnessed by his roommate did not accompany him to the hospital per EMS seizure occurred approximately 30 minutes prior to arrival.  He had a short period of confusion but is back at baseline at this time.  He denies any recent URI symptoms, nausea, vomiting, diarrhea, change in medication, alcohol or drug use.  States he's been compliant with his medication has been no change in his medication dosage.  He states he has not stress or sleep deprived.  He has no complaints at this time  Patient is a 18 y.o. male presenting with seizures. The history is provided by the patient.  Seizures Seizure activity on arrival: no   Seizure type:  Unable to specify Preceding symptoms: no sensation of an aura present, no dizziness, no euphoria, no headache, no hyperventilation, no nausea, no numbness, no panic and no vision change   Initial focality:  Unable to specify Episode characteristics comment:  Witness did not accompany patient to hospital Postictal symptoms: confusion   Return to baseline: yes   Severity:  Unable to specify Duration: no report as to duration  Timing:  Once Progression:  Resolved Context: not cerebral palsy, not change in medication, not sleeping less, not drug use, not emotional upset, not hydrocephalus, medical compliance and not stress   Recent head injury:  No recent head injuries PTA treatment:  None History of seizures: yes     Past Medical History  Diagnosis Date  . Physical growth delay   . Goiter   . Poor appetite   . ADHD (attention deficit  hyperactivity disorder)   . Puberty delay   . Thyroiditis, autoimmune   . Seizures    Past Surgical History  Procedure Laterality Date  . Circumcision     Family History  Problem Relation Age of Onset  . Diabetes Paternal Aunt   . Thyroid disease Paternal Aunt   . Cancer Neg Hx   . Seizures Father 816    6-13 yo unresponsive staring, generalized clonic  . Lung cancer Maternal Grandfather   . Seizures Maternal Grandfather     Related to alcohol use and sleep deprivation. treated w phenobarbital  . Alcohol abuse Maternal Grandfather   . Seizures Paternal Grandfather    History  Substance Use Topics  . Smoking status: Never Smoker   . Smokeless tobacco: Never Used  . Alcohol Use: No    Review of Systems  HENT: Negative for rhinorrhea.   Respiratory: Negative for shortness of breath.   Gastrointestinal: Negative for vomiting and diarrhea.  Genitourinary: Negative for dysuria.  Musculoskeletal: Negative for myalgias.  Neurological: Positive for seizures. Negative for dizziness and headaches.  All other systems reviewed and are negative.     Allergies  Review of patient's allergies indicates no known allergies.  Home Medications   Prior to Admission medications   Medication Sig Start Date End Date Taking? Authorizing Provider  LAMICTAL XR 300 MG TB24 In one month take one tablet at nighttime. Patient taking differently: Take 1 tablet by mouth at bedtime.  06/01/14  Yes Deetta PerlaWilliam H Hickling, MD  lisdexamfetamine (VYVANSE) 30 MG capsule Take 1 capsule (30 mg total) by mouth daily. 06/28/14  Yes Meghan Blankmann, NP  pimecrolimus (ELIDEL) 1 % cream Apply 1 application topically 2 (two) times daily.   Yes Historical Provider, MD   BP 110/62 mmHg  Pulse 97  Temp(Src) 98.6 F (37 C) (Oral)  Resp 21  Ht 5\' 8"  (1.727 m)  Wt 134 lb (60.782 kg)  BMI 20.38 kg/m2  SpO2 97% Physical Exam  Constitutional: He is oriented to person, place, and time. He appears well-developed and  well-nourished.  HENT:  Head: Normocephalic.  Eyes: Pupils are equal, round, and reactive to light.  Neck: Normal range of motion.  Cardiovascular: Normal rate and regular rhythm.   Pulmonary/Chest: Effort normal and breath sounds normal.  Abdominal: Soft. Bowel sounds are normal.  Musculoskeletal: Normal range of motion.  Lymphadenopathy:    He has no cervical adenopathy.  Neurological: He is alert and oriented to person, place, and time. No cranial nerve deficit.  Skin: Skin is warm.  Vitals reviewed.   ED Course  Procedures (including critical care time) Labs Review Labs Reviewed  CBC WITH DIFFERENTIAL - Abnormal; Notable for the following:    RBC 4.13 (*)    Hemoglobin 12.7 (*)    HCT 36.3 (*)    Neutrophils Relative % 36 (*)    Lymphocytes Relative 52 (*)    All other components within normal limits  URINE RAPID DRUG SCREEN (HOSP PERFORMED)  I-STAT CHEM 8, ED  CBG MONITORING, ED    Imaging Review No results found.   EKG Interpretation None     Parents arrived.  They're comfortable taking the child home.  They were reassured that there was no sign of infection.  Slightly sleepy after the Ativan, but wakes easily to verbal stimulation MDM   Final diagnoses:  Seizure         Arman FilterGail K Janney Priego, NP 09/08/14 0103  Loren Raceravid Yelverton, MD 09/08/14 [phone]

## 2014-09-08 LAB — RAPID URINE DRUG SCREEN, HOSP PERFORMED
Amphetamines: NOT DETECTED
BENZODIAZEPINES: NOT DETECTED
Barbiturates: NOT DETECTED
COCAINE: NOT DETECTED
Opiates: NOT DETECTED
TETRAHYDROCANNABINOL: NOT DETECTED

## 2014-09-08 NOTE — Discharge Instructions (Signed)
Epilepsy °Epilepsy is a disorder in which a person has repeated seizures over time. A seizure is a release of abnormal electrical activity in the brain. Seizures can cause a change in attention, behavior, or the ability to remain awake and alert (altered mental status). Seizures often involve uncontrollable shaking (convulsions).  °Most people with epilepsy lead normal lives. However, people with epilepsy are at an increased risk of falls, accidents, and injuries. Therefore, it is important to begin treatment right away. °CAUSES  °Epilepsy has many possible causes. Anything that disturbs the normal pattern of brain cell activity can lead to seizures. This may include:  °· Head injury. °· Birth trauma. °· High fever as a child. °· Stroke. °· Bleeding into or around the brain. °· Certain drugs. °· Prolonged low oxygen, such as what occurs after CPR efforts. °· Abnormal brain development. °· Certain illnesses, such as meningitis, encephalitis (brain infection), malaria, and other infections. °· An imbalance of nerve signaling chemicals (neurotransmitters).   °SIGNS AND SYMPTOMS  °The symptoms of a seizure can vary greatly from one person to another. Right before a seizure, you may have a warning (aura) that a seizure is about to occur. An aura may include the following symptoms: °· Fear or anxiety. °· Nausea. °· Feeling like the room is spinning (vertigo). °· Vision changes, such as seeing flashing lights or spots. °Common symptoms during a seizure include: °· Abnormal sensations, such as an abnormal smell or a bitter taste in the mouth.   °· Sudden, general body stiffness.   °· Convulsions that involve rhythmic jerking of the face, arm, or leg on one or both sides.   °· Sudden change in consciousness.   °¨ Appearing to be awake but not responding.   °¨ Appearing to be asleep but cannot be awakened.   °· Grimacing, chewing, lip smacking, drooling, tongue biting, or loss of bowel or bladder control. °After a seizure,  you may feel sleepy for a while.  °DIAGNOSIS  °Your health care provider will ask about your symptoms and take a medical history. Descriptions from any witnesses to your seizures will be very helpful in the diagnosis. A physical exam, including a detailed neurological exam, is necessary. Various tests may be done, such as:  °· An electroencephalogram (EEG). This is a painless test of your brain waves. In this test, a diagram is created of your brain waves. These diagrams can be interpreted by a specialist. °· An MRI of the brain.   °· A CT scan of the brain.   °· A spinal tap (lumbar puncture, LP). °· Blood tests to check for signs of infection or abnormal blood chemistry. °TREATMENT  °There is no cure for epilepsy, but it is generally treatable. Once epilepsy is diagnosed, it is important to begin treatment as soon as possible. For most people with epilepsy, seizures can be controlled with medicines. The following may also be used: °· A pacemaker for the brain (vagus nerve stimulator) can be used for people with seizures that are not well controlled by medicine. °· Surgery on the brain. °For some people, epilepsy eventually goes away. °HOME CARE INSTRUCTIONS  °· Follow your health care provider's recommendations on driving and safety in normal activities. °· Get enough rest. Lack of sleep can cause seizures. °· Only take over-the-counter or prescription medicines as directed by your health care provider. Take any prescribed medicine exactly as directed. °· Avoid any known triggers of your seizures. °· Keep a seizure diary. Record what you recall about any seizure, especially any possible trigger.   °· Make   sure the people you live and work with know that you are prone to seizures. They should receive instructions on how to help you. In general, a witness to a seizure should:   Cushion your head and body.   Turn you on your side.   Avoid unnecessarily restraining you.   Not place anything inside your  mouth.   Call for emergency medical help if there is any question about what has occurred.   Follow up with your health care provider as directed. You may need regular blood tests to monitor the levels of your medicine.  SEEK MEDICAL CARE IF:   You develop signs of infection or other illness. This might increase the risk of a seizure.   You seem to be having more frequent seizures.   Your seizure pattern is changing.  SEEK IMMEDIATE MEDICAL CARE IF:   You have a seizure that does not stop after a few moments.   You have a seizure that causes any difficulty in breathing.   You have a seizure that results in a very severe headache.   You have a seizure that leaves you with the inability to speak or use a part of your body.  Document Released: 10/05/2005 Document Revised: 07/26/2013 Document Reviewed: 05/17/2013 [facility] Patient Information 2015 [place]. This information is not intended to replace advice given to you by your health care provider. Make sure you discuss any questions you have with your health care provider. Today, your son's urine and blood count are all normal without any indication of infection.  Please make sure he continues to take his medication as prescribed.  X-ray doesn't have rest and fluids

## 2014-09-10 ENCOUNTER — Telehealth: Payer: Self-pay | Admitting: Family Medicine

## 2014-09-10 NOTE — Telephone Encounter (Signed)
Type of Insurance:  Do we have a copy of Insurance Card on File?  Patient's PCP: Selena BattenKim  PCP's NPI:  Treating Provider: Ellison CarwinWilliam Hickling   Treating Provider's NPI:  Treating Provider's Phone Number: [phone]  Treating Provider's Fax Number:  Reason for Visit (diagnosis): seizure

## 2014-09-10 NOTE — Telephone Encounter (Signed)
Thank you for your online Referral submission. Your referral case information was transmitted on 09/10/2014 at 09:00 AM CST Your Referral Number is [id number] faxed to Dr Merleen Millinerhicking office

## 2014-09-17 ENCOUNTER — Telehealth: Payer: Self-pay | Admitting: *Deleted

## 2014-09-17 ENCOUNTER — Telehealth: Payer: Self-pay | Admitting: Family Medicine

## 2014-09-17 DIAGNOSIS — G40309 Generalized idiopathic epilepsy and epileptic syndromes, not intractable, without status epilepticus: Secondary | ICD-10-CM

## 2014-09-17 DIAGNOSIS — G40109 Localization-related (focal) (partial) symptomatic epilepsy and epileptic syndromes with simple partial seizures, not intractable, without status epilepticus: Secondary | ICD-10-CM

## 2014-09-17 DIAGNOSIS — Z79899 Other long term (current) drug therapy: Secondary | ICD-10-CM

## 2014-09-17 NOTE — Telephone Encounter (Signed)
The mother stated she left a message last Monday for Dr. Sharene SkeansHickling. She said the pt had another seizure the Friday before last. She would like to know if the pt needs to come to be seen. The mother can be reached at [phone].

## 2014-09-17 NOTE — Telephone Encounter (Signed)
I left a message for mother to call Marcelino DusterMichelle.  We will be happy to see this patient in follow-up.  He was last seen June 02, 2014.  As best I know these are the first 2 seizures since that time area currently he takes lamotrigine 300 XR.  He has not had a lamotrigine level which needs to be done as an evening trough if he is taking the medication at nighttime.  I ordered this study.

## 2014-09-17 NOTE — Telephone Encounter (Signed)
Thank you for your online Referral submission. Your referral case information was transmitted on 09/17/2014 at 08:45 AM CST Your Referral Number is [id number]  PT MOTHER CALLED  And wanted our office to do a compass referral  For pt  To see dr drew jones referral completed

## 2014-09-19 ENCOUNTER — Telehealth: Payer: Self-pay | Admitting: Family Medicine

## 2014-09-19 NOTE — Telephone Encounter (Signed)
Type of Insurance:  Do we have a copy of Insurance Card on File?  Patient's PCP:  Selena BattenKim  PCP's NPI:  Mom said she does not no who the new doctor will be . It was Pitney BowesMeghan Blankmann   Treating Provider:    Treating Provider's NPI:  Treating Provider's Phone Number:  Treating Provider's Fax Number:  Reason for Visit (diagnosis):  ADD

## 2014-09-20 ENCOUNTER — Other Ambulatory Visit: Payer: Self-pay | Admitting: *Deleted

## 2014-09-20 DIAGNOSIS — G40309 Generalized idiopathic epilepsy and epileptic syndromes, not intractable, without status epilepticus: Secondary | ICD-10-CM

## 2014-09-20 DIAGNOSIS — G40109 Localization-related (focal) (partial) symptomatic epilepsy and epileptic syndromes with simple partial seizures, not intractable, without status epilepticus: Secondary | ICD-10-CM

## 2014-09-20 DIAGNOSIS — Z79899 Other long term (current) drug therapy: Secondary | ICD-10-CM

## 2014-09-20 NOTE — Telephone Encounter (Signed)
I left a voicemail message for Cala BradfordKimberly the patient's mom in regards to scheduling an appointment for the patient per Dr. Darl HouseholderHickling's message that he left for mom on 09/17/14 at 7:22 pm and I need to speak with her about lab orders and clarification on how he's taking the Lamotrigine so that she can be given the correct instructions for lab work up. MB

## 2014-09-20 NOTE — Telephone Encounter (Signed)
I spoke with Mark Nicholson the mom of Mark Nicholson, patient is scheduled with Dr. Sharene SkeansHickling on 12/15 and lab orders along with instructions about labs were given to mom verbally and orders were mailed to her home. Mom agreed and confirmed both. MB

## 2014-09-24 ENCOUNTER — Ambulatory Visit (HOSPITAL_COMMUNITY): Payer: Self-pay | Admitting: Psychiatry

## 2014-09-24 ENCOUNTER — Telehealth (HOSPITAL_COMMUNITY): Payer: Self-pay

## 2014-09-25 ENCOUNTER — Other Ambulatory Visit (HOSPITAL_COMMUNITY): Payer: Self-pay | Admitting: Medical

## 2014-09-25 DIAGNOSIS — F908 Attention-deficit hyperactivity disorder, other type: Secondary | ICD-10-CM

## 2014-09-25 MED ORDER — LISDEXAMFETAMINE DIMESYLATE 30 MG PO CAPS: 30.0000 mg | ORAL_CAPSULE | Freq: Every day | ORAL | Status: DC

## 2014-09-26 ENCOUNTER — Ambulatory Visit (HOSPITAL_COMMUNITY): Payer: Self-pay | Admitting: Psychiatry

## 2014-09-26 ENCOUNTER — Ambulatory Visit (HOSPITAL_COMMUNITY): Payer: Self-pay | Admitting: Medical

## 2014-09-26 ENCOUNTER — Telehealth (HOSPITAL_COMMUNITY): Payer: Self-pay

## 2014-09-27 LAB — CBC WITH DIFFERENTIAL/PLATELET
Basophils Absolute: 0 10*3/uL (ref 0.0–0.1)
Basophils Relative: 0 % (ref 0–1)
Eosinophils Absolute: 0.1 10*3/uL (ref 0.0–0.7)
Eosinophils Relative: 4 % (ref 0–5)
HEMATOCRIT: 38.1 % — AB (ref 39.0–52.0)
Hemoglobin: 13.5 g/dL (ref 13.0–17.0)
LYMPHS ABS: 2 10*3/uL (ref 0.7–4.0)
Lymphocytes Relative: 53 % — ABNORMAL HIGH (ref 12–46)
MCH: 31 pg (ref 26.0–34.0)
MCHC: 35.4 g/dL (ref 30.0–36.0)
MCV: 87.6 fL (ref 78.0–100.0)
MPV: 9 fL — ABNORMAL LOW (ref 9.4–12.4)
Monocytes Absolute: 0.3 10*3/uL (ref 0.1–1.0)
Monocytes Relative: 9 % (ref 3–12)
NEUTROS PCT: 34 % — AB (ref 43–77)
Neutro Abs: 1.3 10*3/uL — ABNORMAL LOW (ref 1.7–7.7)
PLATELETS: 245 10*3/uL (ref 150–400)
RBC: 4.35 MIL/uL (ref 4.22–5.81)
RDW: 13.7 % (ref 11.5–15.5)
WBC: 3.7 10*3/uL — AB (ref 4.0–10.5)

## 2014-09-28 LAB — LAMOTRIGINE LEVEL: LAMOTRIGINE LVL: 12.5 ug/mL (ref 4.0–18.0)

## 2014-09-28 NOTE — Progress Notes (Unsigned)
I looked up lamotrigine to make certain that it becomes a 200 mg XR.  Will discuss this with [name] on Monday.

## 2014-10-01 ENCOUNTER — Telehealth: Payer: Self-pay | Admitting: Family Medicine

## 2014-10-01 NOTE — Telephone Encounter (Signed)
Disregard/njr

## 2014-10-02 ENCOUNTER — Encounter: Payer: Self-pay | Admitting: Pediatrics

## 2014-10-02 ENCOUNTER — Ambulatory Visit (INDEPENDENT_AMBULATORY_CARE_PROVIDER_SITE_OTHER): Payer: 59 | Admitting: Pediatrics

## 2014-10-02 VITALS — BP 122/60 | HR 82 | Ht 68.5 in | Wt 136.0 lb

## 2014-10-02 DIAGNOSIS — G40309 Generalized idiopathic epilepsy and epileptic syndromes, not intractable, without status epilepticus: Secondary | ICD-10-CM

## 2014-10-02 DIAGNOSIS — G40109 Localization-related (focal) (partial) symptomatic epilepsy and epileptic syndromes with simple partial seizures, not intractable, without status epilepticus: Secondary | ICD-10-CM

## 2014-10-02 MED ORDER — LAMICTAL XR 100 MG PO TB24: ORAL_TABLET | ORAL | Status: DC

## 2014-10-02 NOTE — Progress Notes (Signed)
Patient: Mark Nicholson MRN: [id number] Sex: male DOB: Sep 02, 1996  Provider: Deetta PerlaHICKLING,Mihika Surrette H, MD [facility]  Note type: Routine return visit  History of Present Illness: Referral Source: Dr. Kriste BasqueHannah Kim History from: mother, patient, emergency room and [facility] Chief Complaint: Seizures  Mark Nicholson is a 18 y.o. male who was evaluated on October 02, 2014, for the first time since June 01, 2014.  He has a history of localization related epilepsy with secondary generalization, attention-deficit disorder combined type.  He presented on September 07, 2014, following a generalized seizure in the campus dorm room.  He was late to take his lamotrigine.  This was a brief generalized tonic-clonic seizure without warning and with postictal confusion.  He had a lamotrigine level September 26, 2014, that was 12.9 mcg/mL, which is Festus BarrenSelby in the therapeutic range, but obviously not enough to prevent his seizures.  He had not been drinking.  He had adequate sleep the night before and he has not missed his medication, although it was somewhat later than he ordinarily takes it when the seizure occurred.  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  In his words, he is adjusting to the realities of how much he is going to have to work to be successful.  This dose has steadily been increased and he has a nice dose response curve based on drug levels.  He has some problems with sleep hygiene and is probably not getting as much sleep as he needs, but he is not engaging in any other significant high risk behavior.  Review of Systems: 12 system review was remarkable for seizures   Past Medical History Diagnosis Date  . Physical growth delay   . Goiter   . Poor appetite   . ADHD (attention deficit hyperactivity disorder)   . Puberty delay   . Thyroiditis, autoimmune   . Seizures    Hospitalizations: No., Head  Injury: No., Nervous System Infections: No., Immunizations up to date: Yes.    Syncope in the fifth grade which caused him to be dazed, motor tic disorder-class in, short stature, delayed puberty, autoimmune thyroiditis with goiter, attention deficit hyperactivity disorder mixed type, osteochondritis dissecans, Lichen striatus. He is followed by Dr. Molli KnockMichael Brennan. The patient also vitiligo on his face.  He had a single generalized tonic-clonic seizure on September 22, 2010.  EEG performed on October 02, 2010 showed brief generalized electrographic seizures of four seconds in duration without clinical accompaniments.  EEG on September 27, 2012 was normal.  [facility].  Birth History  8 lbs. 1 oz. infant born at 5840 weeks gestational age tube a 18 year old primigravida.  Gestation complicated by a 27 pound weight gain.  Labor lasted for only one hour. Normal spontaneous vaginal delivery. Nursery course was uneventful. Breast feeding over 11 months.  Growth and development was recalled as normal. Nocturnal enuresis until 18 years of age.  Behavior History none  Surgical History Procedure Laterality Date  . Circumcision     Family History family history includes Alcohol abuse in his maternal grandfather; Diabetes in his paternal aunt; Lung cancer in his maternal grandfather; Seizures in his maternal grandfather and paternal grandfather; Seizures (age of onset: 316) in his father; Thyroid disease in his paternal aunt. There is no history of Cancer. Family history is negative for migraines, intellectual disabilities, blindness, deafness, birth defects, chromosomal  disorder, or autism.  Social History . Marital Status: Single    Spouse Name: N/A    Number of Children: N/A  . Years of Education: N/A   Social History Main Topics  . Smoking status: Never Smoker   . Smokeless tobacco: Never Used  . Alcohol Use: No  . Drug Use: No  . Sexual  Activity: No   Social History Narrative   Lives with parents and sister  Educational level university School Attending: [employer] Occupation: Consulting civil engineertudent  Living with college campus during school year and with his parents during breaks  Hobbies/Interest: Enjoys playing video games  School comments [name] is a Consulting civil engineerstudent at [employer] pursuing a degree in Patent attorneyMechanical Engineering, he's not doing well currently, he is adjusting from high school to college.   No Known Allergies  Physical Exam BP 122/60 mmHg  Pulse 82  Ht 5' 8.5" (1.74 m)  Wt 136 lb (61.689 kg)  BMI 20.38 kg/m2  General: alert, well developed, well nourished, in no acute distress, black hair, brown eyes, left handed  Head: normocephalic, no dysmorphic features  Ears, Nose and Throat: Otoscopic: Tympanic membranes normal. Pharynx: oropharynx is pink without exudates or tonsillar hypertrophy.  Neck: supple, full range of motion, no cranial or cervical bruits  Respiratory: auscultation clear  Cardiovascular: no murmurs, pulses are normal  Musculoskeletal: no skeletal deformities or apparent scoliosis  Skin: Vitiligo on his face, no neurocutaneous lesions  Neurologic Exam  Mental Status: alert; oriented to person, place and year; knowledge is normal for age; language is normal  Cranial Nerves: visual fields are full to double simultaneous stimuli; extraocular movements are full and conjugate; pupils are around reactive to light; funduscopic examination shows sharp disc margins with normal vessels; symmetric facial strength; midline tongue and uvula; air conduction is greater than bone conduction bilaterally.  Motor: Normal strength, tone and mass; good fine motor movements; no pronator drift.  Sensory: intact responses to cold, vibration, proprioception and stereognosis  Coordination: good finger-to-nose, rapid repetitive alternating movements and finger apposition  Gait and Station: normal  gait and station: patient is able to walk on heels, toes and tandem without difficulty; balance is adequate; Romberg exam is negative; Gower response is negative  Reflexes: symmetric and diminished bilaterally; no clonus; bilateral flexor plantar responses.  Assessment 1. 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 2. Generalized convulsive epilepsy, G40.309.  Discussion The patient clearly needs to have a higher dose of lamotrigine or if that fails, a different medication at present because he is tolerating lamotrigine well, it makes sense to increase his dose.  Plan He has a full bottle of 300 mg XR lamotrigine.  Give him 100 mg XR supply of medication and have him take that in the morning and 300 mg nighttime.  At the end of the month, he will switch over to 200 mg XR and have him take that twice daily.  At some point, we will check another lamotrigine level.  He will return to see me in six months' time sooner depending upon clinical need.  I spent 30 minutes of face-to-face time with the patient and his mother, more than half of it in consultation.   Medication List   This list is accurate as of: 10/02/14 11:59 P.M.       LAMICTAL XR 300 MG Tb24  Generic drug:  LamoTRIgine  In one month take one tablet at nighttime.     LAMICTAL XR 100 MG Tb24  Generic  drug:  LamoTRIgine  Take 1 tablet by mouth each morning     lisdexamfetamine 30 MG capsule  Commonly known as:  VYVANSE  Take 1 capsule (30 mg total) by mouth daily.     pimecrolimus 1 % cream  Commonly known as:  ELIDEL  Apply 1 application topically 2 (two) times daily.      The medication list was reviewed and reconciled. All changes or newly prescribed medications were explained.  A complete medication list was provided to the patient/caregiver.  Deetta Perla MD

## 2014-10-02 NOTE — Patient Instructions (Signed)
Please work hard to get 8 hours of sleep each night.  Avoid alcohol and if you do not, drink in extreme moderation.

## 2014-10-04 ENCOUNTER — Ambulatory Visit: Payer: 59 | Admitting: Pediatrics

## 2014-10-05 ENCOUNTER — Telehealth: Payer: Self-pay | Admitting: Family Medicine

## 2014-10-05 NOTE — Telephone Encounter (Signed)
Referral completed 10/05/2014  Confirmation Thank you for your online Referral submission. Your referral case information was transmitted on 10/05/2014 at 01:42 PM CST Your Referral Number is [id number] [facility]: Rocco SereneJones Drew A MD  Address: [address], [place], [place] [address]  [phone]

## 2014-10-05 NOTE — Telephone Encounter (Signed)
°  Mom called for a referral to see Sharlyn Bolognarew Jones [place]

## 2014-10-15 ENCOUNTER — Telehealth: Payer: Self-pay

## 2014-10-15 DIAGNOSIS — G40109 Localization-related (focal) (partial) symptomatic epilepsy and epileptic syndromes with simple partial seizures, not intractable, without status epilepticus: Secondary | ICD-10-CM

## 2014-10-15 MED ORDER — LAMICTAL XR 200 MG PO TB24: ORAL_TABLET | ORAL | Status: DC

## 2014-10-15 NOTE — Telephone Encounter (Signed)
completed, placed on your desk, signed

## 2014-10-15 NOTE — Telephone Encounter (Signed)
Mark Nicholson, mom, lvm asking that new/updated Rx for Lamictal XR 200 mg BMN 1 po BID [facility] on [address]/ [address]. Child was seen by Dr. Rexene EdisonH on 10/02/14. I will call he after the Rx is sent.  Mom can be reached at [phone].

## 2014-10-16 ENCOUNTER — Telehealth: Payer: Self-pay | Admitting: Family Medicine

## 2014-10-16 DIAGNOSIS — L80 Vitiligo: Secondary | ICD-10-CM

## 2014-10-16 NOTE — Telephone Encounter (Signed)
Ok. Referral placed

## 2014-10-16 NOTE — Telephone Encounter (Signed)
I called mom to inform her that the Rx was faxed to pharmacy as requested.

## 2014-10-16 NOTE — Telephone Encounter (Signed)
Requesting renewal for the referral to Dr. Donzetta Starchrew Jones- Dermatologist.

## 2014-10-17 NOTE — Telephone Encounter (Signed)
Patient's mother informed

## 2014-10-26 ENCOUNTER — Telehealth: Payer: Self-pay | Admitting: Family Medicine

## 2014-10-26 DIAGNOSIS — F909 Attention-deficit hyperactivity disorder, unspecified type: Secondary | ICD-10-CM

## 2014-10-26 NOTE — Telephone Encounter (Signed)
Requesting renewal for the referral to Dr. Donzetta Starchrew Jones- Dermatologist..  Mom also states he is [facility]  [facility] and wants to know if he needs a referral?

## 2014-10-26 NOTE — Telephone Encounter (Signed)
Ok. Is the [facility] for adhd or other diagnosis so can place referral? Ok to place [facility] Pacific Corporation[facility] Anne. Thanks.

## 2014-10-26 NOTE — Telephone Encounter (Signed)
I called the pts mother and she stated the pt is being seen for ADHD.  She is aware the referral was ordered and the date was entered since he has an appt on 10/31/14.

## 2014-10-26 NOTE — Telephone Encounter (Signed)
Dr Stephens NovemberKim-referral for dermatologist was already approved by you on 12/29--per Gavin PoundDeborah a referral is required for behavioral health.

## 2014-10-31 ENCOUNTER — Ambulatory Visit (INDEPENDENT_AMBULATORY_CARE_PROVIDER_SITE_OTHER): Payer: 59 | Admitting: Medical

## 2014-10-31 ENCOUNTER — Encounter (HOSPITAL_COMMUNITY): Payer: Self-pay | Admitting: Medical

## 2014-10-31 DIAGNOSIS — F064 Anxiety disorder due to known physiological condition: Secondary | ICD-10-CM

## 2014-10-31 DIAGNOSIS — F902 Attention-deficit hyperactivity disorder, combined type: Secondary | ICD-10-CM

## 2014-10-31 DIAGNOSIS — F4320 Adjustment disorder, unspecified: Secondary | ICD-10-CM

## 2014-10-31 DIAGNOSIS — F908 Attention-deficit hyperactivity disorder, other type: Secondary | ICD-10-CM

## 2014-10-31 MED ORDER — LISDEXAMFETAMINE DIMESYLATE 30 MG PO CAPS: 30.0000 mg | ORAL_CAPSULE | Freq: Every day | ORAL | Status: DC

## 2014-10-31 NOTE — Progress Notes (Deleted)
Patient ID: Mark BladeJeffrey D Nicholson, male   DOB: November 30, 1995, 19 y.o.   MRN: [id number]

## 2014-10-31 NOTE — Progress Notes (Signed)
[facility] [address] Progress Note  Mark Nicholson [id number] 19 y.o.  10/31/2014 3:59 PM  Chief Complaint: Adjustment disorder with problems in school;ADHD-other type; Epilepsy  History of Present Illness: Mark Stall, MD at 08/13/2014  3:02 PM       Status: Signed          Subjective:   Patient Name: Mark Nicholson Date of Birth: 09-24-1996 MRN: [id number]  Mark Nicholson presents to the office today for follow-up evaluation and management of his delayed puberty, poor weight gain, growth delay, gynecomastia, vitiligo, and short stature.  HISTORY OF PRESENT ILLNESS:   Mark Nicholson is a 19 y.o. African-American young man.  Mark Nicholson was unaccompanied.  1.  "Mark Nicholson" was first seen in our clinic on 01/28/09 after referral by his primary care provider, Dr. Randell Loop of [facility] for evaluation and management of growth delay in association with taking medication for ADHD. The patient was then 13-1/2.  He reportedly had previously been at about the 50% on both the height growth curve and weight growth curve. His appetite decreased markedly on ADD medications. His growth fell off as well. The patient was only hungry at the very end of the day. He was started on cyproheptadine and liberalization of his diet, and responded well. He did try stopping the cyproheptadine but his weight fell.  He also had a delayed start to puberty secondary to poor weight gain poor growth. Since neither he nor his mother wanted him to take cyproheptadine, I agreed in May 2014 to stop the cyproheptadine as long as he continued to eat and to grow. His appetite did increase later and his growth in weight and height increased accordingly.  2. [name]'s last [facility] visit was on 02/12/14.               A. In the interim, he has been pretty healthy. His appetite is pretty good. He feels that his vitiligo is improving. He has not had any further seizures since his last visit. He remains on Lamictal  and  Vyvanse.             B. He has been followed for pubertal delay. Pubic hair, axillary hair, and genitalia are all increasing in amount/size.       Patient: Mark Nicholson    MRN: [id number] Sex: male DOB: 07/15/1996  Provider: Deetta Perla, MD Location of Care: [facility]  Note type: Routine return visit  History of Present Illness: Referral Source: Dr. Kriste Basque History from: mother, patient, emergency room and [facility] chart Chief Complaint: Seizures  Mark Nicholson is a 19 y.o. male who was evaluated on October 02, 2014, for the first time since June 01, 2014.  He has a history of localization related epilepsy with secondary generalization, attention-deficit disorder combined type.  He presented on September 07, 2014, following a generalized seizure in the campus dorm room.  He was late to take his lamotrigine.  This was a brief generalized tonic-clonic seizure without warning and with postictal confusion.  He had a lamotrigine level September 26, 2014, that was 12.9 mcg/mL, which is Festus Barren in the therapeutic range, but obviously not enough to prevent his seizures.  He had not been drinking.  He had adequate sleep the night before and he has not missed his medication, although it was somewhat later than he ordinarily takes it when the seizure occurred.  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  In his words, he is adjusting to the realities of how much he is going to have to work to be successful.  This dose has steadily been increased and he has a nice dose response curve based on drug levels.  He has some problems with sleep hygiene and is probably not getting as much sleep as he needs, but he is not engaging in any other significant high risk behavior.  IN ADDITION TO THE ABOVE  Pt presents with signed consent for mother to accompany him to this visit He was unable to return for scheduled FU in November  due to school schedule-he is Freshman at [employer] and recurrent problems with seizures as noted above.His mother speaks for him at his behest.She explains that he is an "introvert" and has trouble seeking and asking for help preferring to keep things to himself and try to work them out on his own. His 1st semester did not go well. He flunked multiple subjects Calculus,Philosophy,Chemistry and is on academic probation.Mom wants him to get counseling but he is reluctant.His problems dont stem from attention deficit,rather from his inability to understand and his reluctance to ask for help/reveal his weakness.He appears to have a reluctance to seek on campus help due to his epilepsy and vitiligo which already make him "different" in the eyes of his peers. He is willing to try counseling here at [facility].. Mom doesnt want to try any SSRIs for his anxieties presently feeling he is already on a lot of medication.     Suicidal Ideation: No Plan Formed: NA Patient has means to carry out plan: NA  Homicidal Ideation: No Plan Formed: NA Patient has means to carry out plan: NA  Review of Systems:Pertinent Review of Systems:   Constitutional: The patient feels "good". He seems healthy and active. Eyes: Vision seems to be good with his current eye glasses or contacts. There are no recognized eye problems. Neck: The patient has no complaints of anterior neck swelling, soreness, tenderness, pressure, discomfort, or difficulty swallowing.   Heart: Heart rate increases with exercise or other physical activity. The patient has no complaints of palpitations, irregular heart beats, chest pain, or chest pressure.    Gastrointestinal: Bowel movents seem normal. The patient has no complaints of excessive hunger, acid reflux, upset stomach, stomach aches or pains, diarrhea, or constipation.   Legs: Muscle mass and strength seem normal. There are no complaints of numbness, tingling, burning, or pain. No edema is noted.   Feet: There  are no obvious foot problems. There are no complaints of numbness, tingling, burning, or pain. No edema is noted. Neurologic: There are no recognized problems with muscle movement and strength, sensation, or coordination. Chest:  Breast are flat now.   Skin: He feels that his vitiligo is better after beginning UV light therapy at [facility]. He has the UV therapy 3 times per week.   Psychiatric: Agitation: Negative Hallucination: Negative Depressed Mood: Denies Insomnia: No Hypersomnia: Negative Altered Concentration: Negative Feels Worthless: No Grandiose Ideas: No Belief In Special Powers: No New/Increased Substance Abuse: No Compulsions: No  Neurologic: Headache: Negative Seizure: Yes see HPI Paresthesias: No  Past Medical Family, Social History: Review of Systems: 12 system review was remarkable for seizure    Past Medical History   Diagnosis  Date   .  Physical growth delay     .  Goiter     .  Poor appetite     .  ADHD (attention deficit hyperactivity disorder)     .  Puberty delay     .  Thyroiditis, autoimmune     .  Seizures      Hospitalizations: No., Head Injury: No., Nervous System Infections: No., Immunizations up to date: Yes.    Allergies:NO KNOWN ALLERGIES  Past Medical History  Syncope in the fifth grade which caused him to be dazed, motor tic disorder-class in, short stature, delayed puberty, autoimmune thyroiditis with goiter, attention deficit hyperactivity disorder mixed type, osteochondritis dissecans, Lichen striatus.  He is followed by Dr. Molli Knock. The patient also vitiligo on his face.  He had a single generalized tonic-clonic seizure on September 22, 2010.  EEG performed on October 02, 2010 showed brief generalized electrographic seizures of four seconds in duration without clinical accompaniments.  EEG on September 27, 2012 was normal.  Seen in the ER at [facility] due to a seizure on 05/25/14.  Birth History 8 lbs. 1 oz.  infant born at [redacted] weeks gestational age tube a 19 year old primigravida.   Gestation complicated by a 27 pound weight gain.   Labor lasted for only one hour. Normal spontaneous vaginal delivery. Nursery course was uneventful. Breast feeding over 11 months.   Growth and development was recalled as normal. Nocturnal enuresis until 19 years of age.  Behavior History none  Surgical History Past Surgical History   Procedure  Laterality  Date   .  Circumcision         Family History family history includes Alcohol abuse in his maternal grandfather; Diabetes in his paternal aunt; Lung cancer in his maternal grandfather; Seizures in his maternal grandfather and paternal grandfather; Seizures (age of onset: 14) in his father; Thyroid disease in his paternal aunt. There is no history of Cancer. Family history is negative for migraines, intellectual disabilities, blindness, deafness, birth defects, chromosomal disorder, or autism.  Social History History      Social History   .  Marital Status:  Single       Spouse Name:  N/A       Number of Children:  N/A   .  Years of Education:  N/A      Social History Main Topics   .  Smoking status:  Never Smoker    .  Smokeless tobacco:  Never Used   .  Alcohol Use:  No   .  Drug Use:  No   .  Sexual Activity:  No      Other Topics  Concern   .  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     Has been accepted to [employer], on waiting list at [employer]. 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              Educational level 12th grade School Attending: [employer]   Occupation: Student  Living with both parents and sister  Hobbies/Interest: playing video games School comments Mark Nicholson graduated from [employer] in June 2015. He will be attending college in the fall.  Substance  Abuse History in the last 12 months: none Substance  Age of 1st Use  Last Use  Amount  Specific Type   Nicotine           Alcohol           Cannabis  Opiates           Cocaine           Methamphetamines           LSD           Ecstasy           Benzodiazepines           Caffeine           Inhalants           Others:                                            Social History: Current Place of Residence: [place] Place of Birth:  1996/02/18 Family Members: parents, and sister, age 35   Children: na             Sons: na             Daughters: na Relationships: none  Developmental History: Physical and puberty delay Prenatal History:   Birth History:   Postnatal Infancy:   Developmental History:   Milestones:  Sit-Up:    Crawl:   Walk:   Speech: School History:    Freshman year of college, going to [employer], will study Psychologist, sport and exercise History: The patient has no significant history of legal issues. Hobbies/Interests: computers   Family History:    Family History   Problem  Relation  Age of Onset   .  Diabetes  Paternal Aunt     .  Thyroid disease  Paternal Aunt     .  Cancer  Neg Hx     .  Seizures  Father  55       6-13 yo unresponsive staring, generalized clonic   .  Lung cancer  Maternal Grandfather     .  Seizures  Maternal Grandfather         Related to alcohol use and sleep deprivation. treated w phenobarbital   .  Alcohol abuse  Maternal Grandfather     .  Seizures  Paternal Grandfather                  Social History: Current Place of Residence: [place] Place of Birth:  1995-12-14 Family Members: parents, and sister, age 24   Children: na             Sons: na             Daughters: na Relationships: none  Developmental History: Physical and puberty delay Prenatal History:   Birth History:   Postnatal Infancy:   Developmental History:   Milestones:  Sit-Up:    Crawl:   Walk:   Speech: School History:    Freshman year of  college, going to [employer], will study Psychologist, sport and exercise History: The patient has no significant history of legal issues. Hobbies/Interests: computers    Current Outpatient Prescriptions on File Prior to Visit   Medication  Sig  Dispense  Refill   .  LamoTRIgine (LAMICTAL XR) 250 MG TB24  Take 250 mg by mouth at bedtime.         Marland Kitchen  lisdexamfetamine (VYVANSE) 30 MG capsule  Take 1 capsule (30 mg total) by mouth daily.   30 capsule   0   .  pimecrolimus (ELIDEL) 1 % cream  Apply 1 application topically  2 (two) times daily.            No current facility-administered medications on file prior to visit.     Outpatient Encounter Prescriptions as of 10/31/2014  Medication Sig  . LAMICTAL XR 200 MG TB24 Take 1 tablet by mouth twice daily  . LAMICTAL XR 300 MG TB24   . lisdexamfetamine (VYVANSE) 30 MG capsule Take 1 capsule (30 mg total) by mouth daily.  . pimecrolimus (ELIDEL) 1 % cream Apply 1 application topically 2 (two) times daily.  . [DISCONTINUED] lisdexamfetamine (VYVANSE) 30 MG capsule Take 1 capsule (30 mg total) by mouth daily.    Past Psychiatric History/Hospitalization(s):Past Psychiatric History: Diagnosis: ADHD   Hospitalizations:  no   Outpatient Care:  yes   Substance Abuse Care:  no   Self-Mutilation:  no   Suicidal Attempts:  no   Violent Behaviors:  no     Anxiety: Yes Bipolar Disorder: Negative Depression: ? Mania: Negative Psychosis: Negative Schizophrenia: Negative Personality Disorder: Negative Hospitalization for psychiatric illness: Negative History of Electroconvulsive Shock Therapy: Negative Prior Suicide Attempts: Negative  Physical Exam: Constitutional:  There were no vitals taken for this visit.  General Appearance: alert, oriented, no acute distress and well nourished  Musculoskeletal: Strength & Muscle Tone: within normal limits Gait & Station: normal Patient leans: Front and N/A  Psychiatric: Speech (describe rate, volume,  coherence, spontaneity, and abnormalities if any): Clear/Coherent  Thought Process (describe rate, content, abstract reasoning, and computation): WDL  Associations: Coherent and Relevant  Thoughts: normal  Mental Status: Orientation: oriented to person, place, time/date and situation Mood & Affect: anxiety Attention Span & Concentration: Intact  Medical Decision Making (Choose Three): Review and summation of old records (2) Review meds( 2);Review last session (1)  Assessment: DSM 5 Adjustment disorder with problems at school;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  Physical growth delay     .  Goiter     .  Poor appetite     .  ADHD (attention deficit hyperactivity disorder)     .  Puberty delay     .  Thyroiditis, autoimmune     .  Seizures      Axis IV: economic problems, educational problems, housing problems, occupational problems, other psychosocial or environmental problems, problems related to legal system/crime, problems related to social environment, problems with access to health care services and problems with primary support group Axis V: 51-60 moderate symptoms    AXIS I  ADHD, combined type   AXIS II  Deferred   AXIS III  Past Medical History   Diagnosis  Date   .  Physical growth delay     .  Goiter     .  Poor appetite     .  ADHD (attention deficit hyperactivity disorder)     .  Puberty delay     .  Thyroiditis, autoimmune     .  Seizures        AXIS IV  economic problems, educational problems, housing problems, other psychosocial or environmental problems, problems related to legal system/crime, problems related to social environment, problems with access to health care services and problems with primary support group   AXIS V  51-60 moderate symptoms     Plan: Refill Vyvanse  Schedule counseling session  here ASAP           FU 1 month  Court Joy, PA-C 10/31/2014

## 2014-11-02 ENCOUNTER — Telehealth (HOSPITAL_COMMUNITY): Payer: Self-pay

## 2014-11-02 NOTE — Telephone Encounter (Signed)
Patient's Mother, Mark Nicholson's called stating she was having to reapply for insurance assistance for Vyvanse and could not afford cost of $270 a month until review goes through.  Questions if can have samples.  Followed up with Dr. Lucianne Nicholson and no samples available.  [name] reported she had contacted Vyvanse and could not get assistance for medications this month.  Agreed with plan to attempt to refill half prescription until could get paperwork to go through for insurance.  Will call back if any other concerns.

## 2014-11-05 ENCOUNTER — Telehealth (HOSPITAL_COMMUNITY): Payer: Self-pay | Admitting: *Deleted

## 2014-11-05 ENCOUNTER — Other Ambulatory Visit (HOSPITAL_COMMUNITY): Payer: Self-pay | Admitting: Psychiatry

## 2014-11-05 DIAGNOSIS — F908 Attention-deficit hyperactivity disorder, other type: Secondary | ICD-10-CM

## 2014-11-05 MED ORDER — LISDEXAMFETAMINE DIMESYLATE 30 MG PO CAPS: 30.0000 mg | ORAL_CAPSULE | Freq: Every day | ORAL | Status: DC

## 2014-11-05 NOTE — Telephone Encounter (Signed)
Called patients mother Cala BradfordKimberly back. Patients mother stated that she will have to pick the form up this afternoon but will be here before 5 pm. Patients mother stated that per the pharmacy she will need a new prescription printed to bring with her to the pharmacy with [facility] form. Will pass message on to Everlene BallsShawn Taylor., RN.

## 2014-11-05 NOTE — Telephone Encounter (Signed)
90 day prescription done for Vyvanse

## 2014-11-14 ENCOUNTER — Telehealth: Payer: Self-pay | Admitting: Family Medicine

## 2014-11-14 NOTE — Telephone Encounter (Signed)
Confirmation Thank you for your online Referral submission. Your referral case information was transmitted on 11/14/2014 at 09:46 AM CST Your Referral Number is [id number]  referral done for patient to see  [facility]: Rocco SereneJones Drew A MD  Address: [address], [place], [place] [address]  [phone] appt scheduled for today fax referral to [phone]

## 2014-11-15 ENCOUNTER — Encounter (HOSPITAL_COMMUNITY): Payer: Self-pay | Admitting: Psychology

## 2014-11-15 ENCOUNTER — Ambulatory Visit (INDEPENDENT_AMBULATORY_CARE_PROVIDER_SITE_OTHER): Payer: 59 | Admitting: Psychology

## 2014-11-15 DIAGNOSIS — F4320 Adjustment disorder, unspecified: Secondary | ICD-10-CM

## 2014-11-15 DIAGNOSIS — F900 Attention-deficit hyperactivity disorder, predominantly inattentive type: Secondary | ICD-10-CM

## 2014-11-15 NOTE — Progress Notes (Signed)
Mark BladeJeffrey D Muenchow is a 19 y.o. male patient referred for counseling by Maryjean Mornharles Kober, PA.    Patient:   Mark Nicholson   DOB:   [birth year]  MR Number:  [id number]  [facility] [place] [id number] [place] [address] Dept: [phone]           Date of Service:   11/15/14  Start Time:   10am End Time:   10.58am  Provider/Observer:  Forde RadonLeanne Xoey [facility]       Billing Code/Service: [phone]  Chief Complaint:     Chief Complaint  Patient presents with  . Establish Care  . Academic Problems    Reason for Service:  Pt is referred by Maryjean Mornharles Kober, PA for adjustment disorder with school problems.  Pt has hx of ADHD that he has been tx for since [employer].  Pt had been an A/B student throughout high school.  Pt attended 1st semester of college at [employer] and struggled academically only passing 2 of his 5 classes.  Pt reported that it was a significant adjustment for him learning that he needs to study.  Pt also reports mom feels that he needs to socializing more- stays to self too much.  Pt describes self as introvert and reports some anxiety in social situations but no avoidance or excessive worry.    Current Status:  Pt denies any symptoms of depression.  Pt reports he sleeps well and no loss of interest.  Pt reports that his ADHD is well managed with medication. Pt reports that he learned a lot about self and lack of study habits first semester of college and aware of need to study.  Pt reports that he attends class daily.  Pt reported that he has close friends at school and from high school that he remains in touch with.  Pt also reports close with family and does go home on weekends.    Reliability of Information: Pt provided information.   Behavioral Observation: Mark Nicholson  presents as a 19 y.o.-year-old  African American Male who appeared his stated age. his dress was Appropriate  and he was Well Groomed and his manners were Appropriate to the situation.  There were not any physical disabilities noted.  he displayed an appropriate level of cooperation and motivation.    Interactions:    Active   Attention:   within normal limits  Memory:   within normal limits  Visuo-spatial:   not examined  Speech (Volume):  normal  Speech:   normal pitch and normal volume  Thought Process:  Coherent and Relevant  Though Content:  WNL  Orientation:   person, place, time/date and situation  Judgment:   Good  Planning:   Fair  Affect:    Anxious and Appropriate  Mood:    Euthymic  Insight:   Good  Intelligence:   normal  Marital Status/Living: Pt lives on campus at [employer]- has a roommate that he reports he gets along well with.  [place] is from [place].  He does visit his home where his mother, father and 15y/o sister live almost every weekend.  Pt reports his father is a Education officer, environmentalpastor.  Pt reports he gets along well w/ all his family but closest with dad who he feels he has a lot in common with.  Pt doesn't yet have his drivers license due to seizure d/o.   Suppports/Strengths:  Pt reports that his parents are his  major supports.  Pt reports grown up in church as father is a Education officer, environmental and strong faith. Pt reports that he has a best friends at [employer] and several close friends from high school he remains in touch with.  Pt reports he enjoys gaming, building things w/ his hands, listening to music and is indoors.    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  Past Employment:  PT employment at [employer] [phone].  Substance Use:  No concerns of substance abuse are reported.  Pt denies any use of drugs or alcohol.    Education:   Pt is a Printmaker at [employer].  he is in the honors program and has a Runner, broadcasting/film/video.  pt reports he is on academic probation for his first semester academic performance.  Pt reports  current classes are Eng 101, Calculus, Patent attorney, Engineering class about careers Environmental manager.  Medical History:   Past Medical History  Diagnosis Date  . Physical growth delay   . Goiter   . Poor appetite   . Puberty delay   . Thyroiditis, autoimmune   . ADHD (attention deficit hyperactivity disorder)     dx elementary school  . Seizures     1st in 9th grade        Outpatient Encounter Prescriptions as of 11/15/2014  Medication Sig  . LAMICTAL XR 300 MG TB24   . lisdexamfetamine (VYVANSE) 30 MG capsule Take 1 capsule (30 mg total) by mouth daily.                Pt reports taking medication as prescribed.  Pt reports last seizure Spring 2015.   Sexual History:   History  Sexual Activity  . Sexual Activity: No    Abuse/Trauma History: Pt denies any trauma or major losses.   Psychiatric History:  No past counseling.   Family Med/Psych History:  Family History  Problem Relation Age of Onset  . Diabetes Paternal Aunt   . Thyroid disease Paternal Aunt   . Cancer Neg Hx   . Seizures Father 40    6-13 yo unresponsive staring, generalized clonic  . Lung cancer Maternal Grandfather   . Seizures Maternal Grandfather     Related to alcohol use and sleep deprivation. treated w phenobarbital  . Alcohol abuse Maternal Grandfather   . Seizures Paternal Grandfather     Risk of Suicide/Violence: virtually non-existent no hx of SI/HI.  No hx of aggression towards self or others.    Impression/DX:  Pt is a 19y/o male who presents for counseling as referred by [name], PA.  Pt has hx of ADHD and seizure d/o.  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  Pt reported that he wasn't studying and this effected his grades greatly and having to learn study habits and more academic independence.  Pt denies any symptoms of depression.  Pt admit to some anxiety in social situations- but not effecting his functioning.    Disposition/Plan:  Pt to f/u in couple of weeks for counseling. F/u as scheduled for medication management.   Diagnosis:    :  Attention deficit hyperactivity disorder (ADHD), predominantly inattentive type  Adjustment disorder with problems at school             [place], [facility]

## 2014-11-28 ENCOUNTER — Telehealth: Payer: Self-pay

## 2014-11-28 DIAGNOSIS — G40109 Localization-related (focal) (partial) symptomatic epilepsy and epileptic syndromes with simple partial seizures, not intractable, without status epilepticus: Secondary | ICD-10-CM

## 2014-11-28 DIAGNOSIS — Z79899 Other long term (current) drug therapy: Secondary | ICD-10-CM

## 2014-11-28 DIAGNOSIS — G40309 Generalized idiopathic epilepsy and epileptic syndromes, not intractable, without status epilepticus: Secondary | ICD-10-CM

## 2014-11-28 MED ORDER — LAMOTRIGINE ER 200 MG PO TB24: ORAL_TABLET | ORAL | Status: DC

## 2014-11-28 NOTE — Telephone Encounter (Signed)
Please let Mom know that we can fill as generic but Mark Nicholson will need to have a blood test one 1 week after switching to generic formulation. There can be changes in the absorption of the drug when switching from brand to generic, so we need to do the blood test to compare to last blood level, and see if we need to make adjustment in dose. Let mom know that I will mail her a blood test order and that it needs to be drawn in 1 week from change of medication, and must be drawn in the morning before he takes his first dose. He should take the medication after the blood has been drawn. I [facility] the Rx [facility] to [facility], which is the pharmacy associated with the phone number listed below.  [name]

## 2014-11-28 NOTE — Telephone Encounter (Signed)
I called mom and gave her the information listed below. She expressed understanding.

## 2014-11-28 NOTE — Telephone Encounter (Signed)
Mark Nicholson, mom, called and stated that pharmacist at [facility] will not refill child's Lamictal XR 200 mg tab 1 tab po bid as a generic. She said that she cannot afford the brand name until after the deductible has been met. She said pharmacy is requesting call from our office to let them know it is okay to fill as generic. I told mother that I would get with the provider and that if it is a problem, I will call her back otherwise for her to check with the pharmacy later today. She expressed understanding. [facility]'s number is [phone]. Mother's number is [phone].

## 2014-12-05 ENCOUNTER — Telehealth: Payer: Self-pay | Admitting: Family Medicine

## 2014-12-05 ENCOUNTER — Encounter (HOSPITAL_COMMUNITY): Payer: Self-pay | Admitting: Medical

## 2014-12-05 ENCOUNTER — Ambulatory Visit (INDEPENDENT_AMBULATORY_CARE_PROVIDER_SITE_OTHER): Payer: 59 | Admitting: Medical

## 2014-12-05 DIAGNOSIS — F908 Attention-deficit hyperactivity disorder, other type: Secondary | ICD-10-CM

## 2014-12-05 DIAGNOSIS — S62109A Fracture of unspecified carpal bone, unspecified wrist, initial encounter for closed fracture: Secondary | ICD-10-CM

## 2014-12-05 DIAGNOSIS — F4320 Adjustment disorder, unspecified: Secondary | ICD-10-CM

## 2014-12-05 DIAGNOSIS — G40309 Generalized idiopathic epilepsy and epileptic syndromes, not intractable, without status epilepticus: Secondary | ICD-10-CM

## 2014-12-05 MED ORDER — LISDEXAMFETAMINE DIMESYLATE 30 MG PO CAPS: 30.0000 mg | ORAL_CAPSULE | Freq: Every day | ORAL | Status: DC

## 2014-12-05 NOTE — Telephone Encounter (Signed)
Confirmation Thank you for your online Referral submission. Your referral case information was transmitted on 12/05/2014 at 09:38 AM CST Your Referral Number is [id number] Referral completed  Appt scheduled for 12-05-2014  [facility]: Rocco SereneJones Drew A MD  Address: [address], [place], [place] [address]  [phone]

## 2014-12-05 NOTE — Telephone Encounter (Signed)
Ok to place referral for ortho and they can make any PT/OT referrals as needed.

## 2014-12-05 NOTE — Progress Notes (Signed)
   [facility] Follow-up Outpatient Visit  Mark BladeJeffrey D Nicholson Feb 20, 1996  Date: 12/05/2014   Subjective: 1 month FU for ADHD and academic problems at school he is unaccompanied today and admits that problems at school were related to his homework ethic rather tahn his medical and psychiatric problems.He did dsee counselor Sharene SkeansLee Ann Yeatts 1 time but doesnt feel need to continue with counseling  There were no vitals filed for this visit.  Mental Status Examination  Appearance: Well groomed;well nourished Alert: Yes Attention: good  Cooperative: Yes Eye Contact: Good Speech: Clear and coherent Psychomotor Activity: Normal Memory/Concentration: Intact Oriented: person, place, time/date and situation Mood: Euthymic Affect: Congruent Thought Processes and Associations: Coherent and Logical Fund of Knowledge: Good Thought Content: NO  Suicidal ideation, Homicidal ideation, Auditory hallucinations, Visual hallucinations, Delusions and Paranoia Insight: Good Judgement: Good  Diagnosis: ADHD other type  Treatment Plan: Refill Vyvanse FU May  Sherise Geerdes E, [place]

## 2014-12-05 NOTE — Telephone Encounter (Signed)
Pt went to [facility] and they referred pt to  dr Amanda Peagramig for broken wrist. Pt has [facility] compass ins . Can we do referral. Pt needs OT . Pt has an appt on 12-10-14

## 2014-12-05 NOTE — Telephone Encounter (Signed)
Patient's mother informed

## 2014-12-06 ENCOUNTER — Telehealth: Payer: Self-pay | Admitting: Family Medicine

## 2014-12-06 ENCOUNTER — Ambulatory Visit (HOSPITAL_COMMUNITY): Payer: Self-pay | Admitting: Psychology

## 2014-12-06 NOTE — Telephone Encounter (Signed)
Confirmation Your referral case information was transmitted on 12/06/2014 at 10:20 AM CST Your Referral Number is [id number]  Pt scheduled for 12-10-2014 [facility] Address: [address], [place], [place] [address] [phone]  Faxed referral to [phone]

## 2014-12-12 ENCOUNTER — Telehealth: Payer: Self-pay | Admitting: Family Medicine

## 2014-12-12 NOTE — Telephone Encounter (Signed)
Pt Mom called to say that Dr Sharene SkeansHickling has given her a order for a lab draw. Is it ok for the patient to have labs drawn here.

## 2014-12-12 NOTE — Telephone Encounter (Signed)
I called the pts mother and left a detailed message stating per Dr Selena BattenKim she should have Dr Sharene SkeansHickling draw the labs at his office to be sure there is no delay in results due to his levels of medications and to call back with any questions.

## 2014-12-20 LAB — LAMOTRIGINE LEVEL: LAMOTRIGINE LVL: 12.9 ug/mL (ref 4.0–18.0)

## 2014-12-21 ENCOUNTER — Telehealth: Payer: Self-pay | Admitting: Pediatrics

## 2014-12-21 NOTE — Telephone Encounter (Signed)
-----   Message from Elveria Risingina Goodpasture, NP sent at 12/20/2014  7:55 AM EST ----- This was drawn because he had to change from brand to generic Lamotrigine due to cost of brand drug.  [name] ----- Message -----    From: Lab in [facility] Interface    Sent: 12/20/2014   2:57 AM      To: Elveria Risingina Goodpasture, NP

## 2014-12-21 NOTE — Telephone Encounter (Signed)
Drug levels were virtually identical on generic, and trade drug.  I called mother.  I would make no change.  This is not guarantee that he won't have seizures, but it makes it less likely.

## 2015-01-28 ENCOUNTER — Other Ambulatory Visit: Payer: Self-pay | Admitting: Family

## 2015-02-05 ENCOUNTER — Telehealth (HOSPITAL_COMMUNITY): Payer: Self-pay | Admitting: *Deleted

## 2015-02-05 NOTE — Telephone Encounter (Signed)
Received prior authorization for vyvanse. Called [phone] [facility] with [facility] who gave approval from 02/05/15-02/05/16. Approval #[id number]. Notified pharmacy.

## 2015-02-05 NOTE — Telephone Encounter (Signed)
[facility]vanse [facility].  [id number] Sent to be scanned in

## 2015-02-07 ENCOUNTER — Telehealth: Payer: Self-pay | Admitting: Family Medicine

## 2015-02-07 NOTE — Telephone Encounter (Signed)
Confirmation Thank you for your online Referral submission. Your referral case information was transmitted on 02/07/2015 at 01:37 PM CDT Your Referral Number is [id number] REFERRAL COMPLETE FOR PT MOTHER CALLED IN WANTED REFERRAL DONE Fax: [phone]

## 2015-02-11 ENCOUNTER — Other Ambulatory Visit: Payer: Self-pay | Admitting: "Endocrinology

## 2015-02-12 LAB — TESTOSTERONE, FREE, TOTAL, SHBG
Sex Hormone Binding: 18 nmol/L (ref 10–50)
TESTOSTERONE FREE: 83.1 pg/mL (ref 47.0–244.0)
Testosterone-% Free: 2.7 % (ref 1.6–2.9)
Testosterone: 311 ng/dL (ref 300–890)

## 2015-02-12 LAB — FOLLICLE STIMULATING HORMONE: FSH: 15.2 m[IU]/mL (ref 1.4–18.1)

## 2015-02-12 LAB — LUTEINIZING HORMONE: LH: 8.5 m[IU]/mL

## 2015-02-13 ENCOUNTER — Other Ambulatory Visit: Payer: Self-pay | Admitting: *Deleted

## 2015-02-13 DIAGNOSIS — E034 Atrophy of thyroid (acquired): Secondary | ICD-10-CM

## 2015-02-18 ENCOUNTER — Ambulatory Visit (INDEPENDENT_AMBULATORY_CARE_PROVIDER_SITE_OTHER): Payer: 59 | Admitting: "Endocrinology

## 2015-02-18 ENCOUNTER — Encounter: Payer: Self-pay | Admitting: "Endocrinology

## 2015-02-18 VITALS — BP 108/68 | HR 97 | Wt 144.0 lb

## 2015-02-18 DIAGNOSIS — E063 Autoimmune thyroiditis: Secondary | ICD-10-CM | POA: Diagnosis not present

## 2015-02-18 DIAGNOSIS — R6250 Unspecified lack of expected normal physiological development in childhood: Secondary | ICD-10-CM

## 2015-02-18 DIAGNOSIS — E049 Nontoxic goiter, unspecified: Secondary | ICD-10-CM | POA: Diagnosis not present

## 2015-02-18 DIAGNOSIS — L80 Vitiligo: Secondary | ICD-10-CM

## 2015-02-18 DIAGNOSIS — E300 Delayed puberty: Secondary | ICD-10-CM | POA: Diagnosis not present

## 2015-02-18 NOTE — Progress Notes (Signed)
Subjective:  Patient Name: Levone [name] Carole Binning Date of Birth: 21-Aug-1996  MRN: [id number]  Paxson Harrower  presents to the office today for follow-up evaluation and management of his delayed puberty, poor weight gain, growth delay, gynecomastia, vitiligo, seizures, ADD, and short stature.  HISTORY OF PRESENT ILLNESS:   [name] is a 19 y.o. African-American young man.  [name] was unaccompanied.  1. [name] was first seen in our clinic on 01/28/09 after referral by his primary care provider, Dr. Randell Loop of [facility] for evaluation and management of growth delay in association with taking medication for ADHD. The patient was then 13-1/2.  He reportedly had previously been at about the 50% on both the height growth curve and weight growth curve. His appetite decreased markedly on ADD medications. His growth fell off as well. The patient was only hungry at the very end of the day. He was started on cyproheptadine and liberalization of his diet, and responded well. He did try stopping the cyproheptadine but his weight fell.  He also had a delayed start to puberty secondary to poor weight gain and poor growth. Since neither he nor his mother wanted him to take cyproheptadine, I agreed in May 2014 to stop the cyproheptadine as long as he continued to eat and to grow. His appetite did increase later and his growth in weight and height increased accordingly.  2. [name]'s last [facility] visit was on 08/13/14.   A. In the interim, he has been pretty healthy. His appetite is pretty good. He feels that his vitiligo is improving quite a bit. He has not had any further seizures since his last visit. He remains on Lamictal and  Vyvanse.  B. He has been followed for pubertal delay. Pubic hair, axillary hair, and genitalia are all increasing in amount/size. Voice is deeper.   3. Pertinent Review of Systems:  Constitutional: The patient feels "good". He seems healthy and active. Eyes: Vision seems to be  good with his current eye glasses or contacts. There are no recognized eye problems. Neck: The patient has no complaints of anterior neck swelling, soreness, tenderness, pressure, discomfort, or difficulty swallowing.  Heart: Heart rate increases with exercise or other physical activity. The patient has no complaints of palpitations, irregular heart beats, chest pain, or chest pressure.   Gastrointestinal: Bowel movents seem normal. The patient has no complaints of excessive hunger, acid reflux, upset stomach, stomach aches or pains, diarrhea, or constipation.  Legs: Muscle mass and strength seem normal. There are no complaints of numbness, tingling, burning, or pain. No edema is noted.  Feet: There are no obvious foot problems. There are no complaints of numbness, tingling, burning, or pain. No edema is noted. Neurologic: There are no recognized problems with muscle movement and strength, sensation, or coordination. Chest:  Breast are flat now.  Skin: He feels that his vitiligo is better after beginning UV light therapy at [facility]. He has the UV therapy 2-3 times per week.    PAST MEDICAL, FAMILY, AND SOCIAL HISTORY  Past Medical History  Diagnosis Date  . Physical growth delay   . Goiter   . Poor appetite   . Puberty delay   . Thyroiditis, autoimmune   . ADHD (attention deficit hyperactivity disorder)     dx elementary school  . Seizures     1st in 9th grade    Family History  Problem Relation Age of Onset  . Diabetes Paternal Aunt   . Thyroid disease Paternal Aunt   .  Cancer Neg Hx   . Seizures Father 366    6-13 yo unresponsive staring, generalized clonic  . Lung cancer Maternal Grandfather   . Seizures Maternal Grandfather     Related to alcohol use and sleep deprivation. treated w phenobarbital  . Alcohol abuse Maternal Grandfather   . Seizures Paternal Grandfather      Current outpatient prescriptions:  .  LamoTRIgine XR (LAMICTAL XR) 200 MG TB24, Take 1  tablet by mouth twice daily, Disp: 62 tablet, Rfl: 5 .  lisdexamfetamine (VYVANSE) 30 MG capsule, Take 1 capsule (30 mg total) by mouth daily. Do not fill before 02/01/2015, Disp: 90 capsule, Rfl: 0 .  pimecrolimus (ELIDEL) 1 % cream, Apply 1 application topically 2 (two) times daily., Disp: , Rfl:   Allergies as of 02/18/2015  . (No Known Allergies)     reports that he has never smoked. He has never used smokeless tobacco. He reports that he does not drink alcohol or use illicit drugs. Pediatric History  Patient Guardian Status  . Mother:  Christean LeafMeadows,[name]  . Father:  Notaro,Pawan   Other Topics Concern  . Not on file   Social History Narrative   He is in 12th grade at [employer]    Lives with parents and sister   Studying computers and Public relations account executiveengineering at [employer] in [place].   Has been accepted to [employer], on waiting list at [employer]. 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        1. 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 2. Activities: He walks around campus a lot. He sometimes plays low-intensity football with friends. H wants to re-learn how to play the saxophone again.  3. Primary Care Provider: Terressa KoyanagiKIM, HANNAH R., DO, [facility] [place] 4. Neurology: Dr. Sharene SkeansHickling  REVIEW OF SYSTEMS: There are no other significant problems involving [name]'s other body systems.   Objective:  Vital Signs:  BP 108/68 mmHg  Pulse 97  Wt 144 lb (65.318 kg)   Ht Readings from Last 3 Encounters:  10/02/14 5' 8.5" (1.74 m) (36 %*, Z = -0.36)  09/07/14 5\' 8"  (1.727 m) (30 %*, Z = -0.54)  06/28/14 5' 7.5" (1.715 m) (24 %*, Z = -0.70)   * Growth percentiles are based on CDC 2-20 Years data.   Wt Readings from Last 3 Encounters:  02/18/15 144 lb (65.318 kg) (34 %*, Z = -0.42)  10/02/14 136 lb (61.689 kg) (23 %*, Z = -0.75)  09/07/14 134 lb (60.782 kg) (20 %*, Z = -0.84)   * Growth percentiles are based on CDC  2-20 Years data.   HC Readings from Last 3 Encounters:  No [facility] found for [facility]   There is no height on file to calculate BSA. No height on file for this encounter. 34%ile (Z=-0.42) based on CDC 2-20 Years weight-for-age data using vitals from 02/18/2015.    PHYSICAL EXAM:  Constitutional: The patient appears healthy, but slender.  His height is still increasing, but slowly. He has gained 8 pounds in weight. His weight percentile is still less than his height percentile. He is bright and alert.  Head: The head is normocephalic. Face: His vitiligo is better. He has more pink areas and brown areas and no white areas. The pigment is slowly returning toward normal.  Eyes: The eyes appear to be normally formed and spaced. Gaze is conjugate. There is no obvious arcus or proptosis. Moisture appears normal.  Ears: The ears are normally placed and appear externally normal. Mouth: The oropharynx and tongue appear normal. Dentition appears to be normal for age. Oral moisture is normal. Neck: The neck appears to be visibly normal. No carotid bruits are noted. The strap muscles are appropriately enlarged. The thyroid gland is probably about the same size at 23-24 grams in size. Both lobes are fairly symmetrically enlarged. The consistency of the thyroid gland is relatively "full". The thyroid gland is not tender to palpation. He has +1 acanthosis. Lungs: The lungs are clear to auscultation. Air movement is good. Heart: Heart rate and rhythm are regular. Heart sounds S1 and S2 are normal. I did not appreciate any pathologic cardiac murmurs. Abdomen: The abdomen is normal in size for the patient's age. Bowel sounds are normal. There is no obvious hepatomegaly, splenomegaly, or other mass effect.  Arms: Muscle size and bulk are normal for age. Hands: There is no obvious tremor. Phalangeal and metacarpophalangeal joints are normal. Palmar muscles are normal for age. Palmar skin is normal. Palmar moisture is also  normal. Legs: Muscles appear normal for age. No edema is present. Neurologic: Strength is normal for age in both the upper and lower extremities. Muscle tone is normal. Sensation to touch is normal in both legs.    LAB DATA:  02/13/15: Pending   02/12/14: TSH 1.060, Free T4 1.13, free T3 3.4  07/19/13: TSH 0.756, free T4 1.19, free T3 3.6  02/13/13: TSH 1.333, free T4 0.98, free T3 3.2  10/03/12: TSH 1.285, free T4 1.34, free T3 3.5   12/28/11: TSH 2.154, free T4 1.18, free T3 3.6  IMAGING: Bone age on 02/20/13: BA 14 at CA 17. I reviewed the study. The BA was actually 14.5 years.   Assessment and Plan:   ASSESSMENT:  1. Growth delay: His height is slowly increasing, but still increasing.  2. Pubertal delay: His puberty has progressed nicely clinically.  We need to see his testosterone, FSH, and LH to assess his puberty from a hormonal viewpoint.   3. Decreased appetite/weight loss: These problems have resolved. 4. Vitiligo: The vitiligo is definitely improving.  5. Goiter: His thyroid gland is about the same size today. He was euthyroid in April 2015. We'll see what his TFTs from last week look like. He will probably become hypothyroid in the next 5 years.  His TFTs have been mid-range normal since December 2013. He is clinically euthyroid today.  6. Hashimoto's Disease: The presence of positive antibodies in the setting of a goiter that waxes and wanes and his positive family history all suggest that [name] will eventually develop hypothyroidism. At present, however, the thyroiditis is clinically quiescent.  PLAN:  1. Diagnostic: TFTs, LH, FSH, and testosterone prior to next visit.  2. Therapeutic: Eat normally.  Try to fit in more exercise. 3. Patient education: Discussed growth, vitiligo, and autoimmune diseases.  4. Follow-up: 6 months   Level of Service: This visit lasted in excess of 40 minutes. More than 50% of the visit was devoted to counseling.  David Stall,  MD

## 2015-02-18 NOTE — Patient Instructions (Signed)
Follow up visit in 6 months. Please have lab tests drawn about one week prior to next visit.  

## 2015-02-20 ENCOUNTER — Encounter: Payer: Self-pay | Admitting: *Deleted

## 2015-03-06 ENCOUNTER — Encounter (HOSPITAL_COMMUNITY): Payer: Self-pay | Admitting: Medical

## 2015-03-06 ENCOUNTER — Ambulatory Visit (INDEPENDENT_AMBULATORY_CARE_PROVIDER_SITE_OTHER): Payer: 59 | Admitting: Medical

## 2015-03-06 VITALS — BP 126/73 | HR 91 | Ht 68.5 in | Wt 142.0 lb

## 2015-03-06 DIAGNOSIS — F064 Anxiety disorder due to known physiological condition: Secondary | ICD-10-CM | POA: Diagnosis not present

## 2015-03-06 DIAGNOSIS — F4320 Adjustment disorder, unspecified: Secondary | ICD-10-CM

## 2015-03-06 DIAGNOSIS — G40309 Generalized idiopathic epilepsy and epileptic syndromes, not intractable, without status epilepticus: Secondary | ICD-10-CM

## 2015-03-06 DIAGNOSIS — F900 Attention-deficit hyperactivity disorder, predominantly inattentive type: Secondary | ICD-10-CM

## 2015-03-06 DIAGNOSIS — F908 Attention-deficit hyperactivity disorder, other type: Secondary | ICD-10-CM

## 2015-03-06 MED ORDER — LISDEXAMFETAMINE DIMESYLATE 30 MG PO CAPS: 30.0000 mg | ORAL_CAPSULE | Freq: Every day | ORAL | Status: DC

## 2015-03-06 NOTE — Progress Notes (Signed)
Mark Nicholson  Progress Note  Mark Nicholson [id number] 19 y.o.  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 Pt has h/o seizures, and is on lamotrigine ER 250 mg hs. In addition, he has h/o :  1. 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    2. Gynecomastia: His breast buds have disappeared. His areolae are flat. His gynecomastia has essentially resolved. We do not need to follow this problem any longer.      3. Pubertal delay: His puberty has progressed nicely.    4. Decreased appetite/weight loss: These problems have resolved. 5. Vitiligo: The vitiligo seems to be improving.   6. Goiter: His thyroid gland is larger and softer today. These findings imply less inflammation by Hashimoto's disease WBCs, but could also occur if the TSIs are higher. He was euthyroid in April. He will probably become hypothyroid in the next 5 years.  His TFTs have been mid-range normal since December 2013. He is clinically euthyroid today.   7. Hashimoto's Disease: The presence of positive antibodies in the setting of a goiter that waxes and wanes and his positive family history all suggest that Mark Nicholson will eventually develop hypothyroidism. At present, however, the thyroiditis is clinically quiescent.  Marland KitchenHe is started freshman year in college, at [employer]; to study Dance movement psychotherapist.His 1st semester did not go well. He flunked multiple subjects Calculus,Philosophy,Chemistry and is on academic probation.Mom wants him to get counseling but he is reluctant.His problems dont stem from attention deficit,rather from his inability to understand and his reluctance to ask for help/reveal his weakness.He  appears to have had a reluctance to seek on campus help due to his epilepsy and vitiligo which already make him "different" in the eyes of his peers.He did see counselor Mark Nicholson 1 time at [facility] but didnt feel need to continue with counseling.   Pt is on vyvanse 30 mg po and is doing well on it. Sleeping and eating are better. He denies any depression, anxiety, mania, or psychosis. He denies SI/HI/AVH. Calm, cooperative, during interview.concentration is good. Noted to have white patches on his face, and is on a topical cream.Doing better at school.Mom came with him but did not attend visit. Admitted that problems at school were related to his homework ethic rather than his medical and psychiatric problems..  Suicidal Ideation: Negative Plan Formed: NA Patient has means to carry out plan: NA  Homicidal Ideation: Negative Plan Formed: NA Patient has means to carry out plan: NA  Review of Systems:   Constitutional: The patient feels "good". He seems healthy and active. Eyes: Vision seems to be good with his current eye glasses or contacts. There are no recognized eye problems. Neck: The patient has no complaints of anterior neck swelling, soreness, tenderness, pressure, discomfort, or difficulty swallowing.   Heart: Heart rate increases with exercise or other physical activity. The patient has no complaints of palpitations, irregular heart beats, chest pain, or chest pressure.    Gastrointestinal: Bowel movents seem normal. The patient has no complaints of excessive hunger, acid reflux, upset stomach, stomach aches or pains, diarrhea, or constipation.   Legs: Muscle mass and strength seem normal. There are no complaints of numbness, tingling,  burning, or pain. No edema is noted.   Feet: There are no obvious foot problems. There are no complaints of numbness, tingling, burning, or pain. No edema is noted. Neurologic: There are no recognized problems with muscle movement and strength, sensation, or  coordination. Chest:  Breast are flat now.   Skin: He feels that his vitiligo is better after [facility]. He has the UV therapy 3 times per week.    Psychiatric: Agitation: Negative Hallucination: Negative Depressed Mood: Denies Insomnia: No Hypersomnia: Negative Altered Concentration: Negative Feels Worthless: No Grandiose Ideas: No Belief In Special Powers: No New/Increased Substance Abuse: No Compulsions: No  Neurologic: Headache: Negative Seizure: Yes Paresthesias: Negative  Past Medical Family, Social History: Review of Systems:Pertinent Review of Systems:   Constitutional: The patient feels "good". He seems healthy and active. Eyes: Vision seems to be good with his current eye glasses or contacts. There are no recognized eye problems. Neck: The patient has no complaints of anterior neck swelling, soreness, tenderness, pressure, discomfort, or difficulty swallowing.   Heart: Heart rate increases with exercise or other physical activity. The patient has no complaints of palpitations, irregular heart beats, chest pain, or chest pressure.    Gastrointestinal: Bowel movents seem normal. The patient has no complaints of excessive hunger, acid reflux, upset stomach, stomach aches or pains, diarrhea, or constipation.   Legs: Muscle mass and strength seem normal. There are no complaints of numbness, tingling, burning, or pain. No edema is noted.   Feet: There are no obvious foot problems. There are no complaints of numbness, tingling, burning, or pain. No edema is noted. Neurologic: There are no recognized problems with muscle movement and strength, sensation, or coordination. Chest:  Breast are flat now.   Skin: He feels that his vitiligo is better after beginning [facility]. He has the UV therapy 3 times per week.   Psychiatric: Agitation: Negative Hallucination: Negative Depressed Mood: Denies Insomnia:  No Hypersomnia: Negative Altered Concentration: Negative Feels Worthless: No Grandiose Ideas: No Belief In Special Powers: No New/Increased Substance Abuse: No  Past Medical History    Diagnosis   Date    .   Physical growth delay       .   Goiter       .   Poor appetite       .   ADHD (attention deficit hyperactivity disorder)       .   Puberty delay       .   Thyroiditis, autoimmune       .   Seizures        Hospitalizations: No., Head Injury: No., Nervous System Infections: No., Immunizations up to date: Yes.    Allergies:NO KNOWN ALLERGIES   Substance Abuse History in the last 12 months: none Substance  Age of 1st Use  Last Use  Amount  Specific Type   Nicotine           Alcohol           Cannabis           Opiates           Cocaine           Methamphetamines           LSD           Ecstasy           Benzodiazepines  Caffeine           Inhalants           Others:                                            Social History: Current Place of Residence: [place] Place of Birth:  [birth year] Family Members: parents, and sister, age 20   Children: na             Sons: na             Daughters: na Relationships: none  Developmental History: Physical and puberty delay Prenatal History:   Birth History:   Postnatal Infancy:   Developmental History:   Milestones:  Sit-Up:    Crawl:   Walk:   Speech: School History:    Freshman year of college, going to [employer], will study Psychologist, sport and exercisecomputer engineering Legal History: The patient has no significant history of legal issues. Hobbies/Interests: computers   Family History:    Family History   Problem  Relation  Age of Onset   .  Diabetes  Paternal Aunt     .  Thyroid disease  Paternal Aunt     .  Cancer  Neg Hx     .  Seizures  Father  1006       6-13 yo unresponsive staring, generalized clonic   .  Lung cancer  Maternal Grandfather     .  Seizures  Maternal Grandfather         Related to alcohol use and sleep  deprivation. treated w phenobarbital   .  Alcohol abuse  Maternal Grandfather     .  Seizures  Paternal Grandfather         Outpatient Encounter Prescriptions as of 03/06/2015  Medication Sig  . LAMICTAL XR 200 MG TB24   . LamoTRIgine XR (LAMICTAL XR) 200 MG TB24 Take 1 tablet by mouth twice daily  . lisdexamfetamine (VYVANSE) 30 MG capsule Take 1 capsule (30 mg total) by mouth daily. Do not fill before 02/01/2015  . lisdexamfetamine (VYVANSE) 30 MG capsule Take 1 capsule (30 mg total) by mouth daily. Do not fill before 05/03/2015  . pimecrolimus (ELIDEL) 1 % cream Apply 1 application topically 2 (two) times daily.  . [DISCONTINUED] lisdexamfetamine (VYVANSE) 30 MG capsule Take 1 capsule (30 mg total) by mouth daily.  . [DISCONTINUED] lisdexamfetamine (VYVANSE) 30 MG capsule Take 1 capsule (30 mg total) by mouth daily. Do not fill before 04/03/2015   No facility-administered encounter medications on file as of 03/06/2015.    Past Psychiatric History/Hospitalization(s):Review of Systems:Pertinent Review of Systems:   Constitutional: The patient feels "good". He seems healthy and active. Eyes: Vision seems to be good with his current eye glasses or contacts. There are no recognized eye problems. Neck: The patient has no complaints of anterior neck swelling, soreness, tenderness, pressure, discomfort, or difficulty swallowing.   Heart: Heart rate increases with exercise or other physical activity. The patient has no complaints of palpitations, irregular heart beats, chest pain, or chest pressure.    Gastrointestinal: Bowel movents seem normal. The patient has no complaints of excessive hunger, acid reflux, upset stomach, stomach aches or pains, diarrhea, or constipation.   Legs: Muscle mass and strength seem normal. There are no complaints of numbness, tingling, burning, or pain. No edema is noted.   Feet:  There are no obvious foot problems. There are no complaints of numbness, tingling, burning,  or pain. No edema is noted. Neurologic: There are no recognized problems with muscle movement and strength, sensation, or coordination. Chest:  Breast are flat now.   Skin: He feels that his vitiligo is better after beginning UV light therapy at [facility]. He has the UV therapy 3 times per week.  Psychiatric: Agitation: Negative Hallucination: Negative Depressed Mood: Denies Insomnia: No Hypersomnia: Negative Altered Concentration: Negative Feels Worthless: No Grandiose Ideas: No Belief In Special Powers: No New/Increased Substance Abuse: No Compulsions  Past Psychiatric History: Diagnosis: ADHD   Hospitalizations:  no   Outpatient Care:  yes   Substance Abuse Care:  no   Self-Mutilation:  no   Suicidal Attempts:  no   Violent Behaviors:  no    Anxiety: Negative Bipolar Disorder: Negative Depression: Negative Mania: Negative Psychosis: Negative Schizophrenia: Negative Personality Disorder: Negative Hospitalization for psychiatric illness: Negative History of Electroconvulsive Shock Therapy: Negative Prior Suicide Attempts: Negative  Physical Exam: Constitutional:  BP 126/73 mmHg  Pulse 91  Ht 5' 8.5" (1.74 m)  Wt 142 lb (64.411 kg)  BMI 21.27 kg/m2  General Appearance: alert, oriented, no acute distress and well nourished  Musculoskeletal: Strength & Muscle Tone: within normal limits Gait & Station: normal Patient leans: N/A  Psychiatric: Speech (describe rate, volume, coherence, spontaneity, and abnormalities if any): Normal and comprehensible  Thought Process (describe rate, content, abstract reasoning, and computation): WDL  Associations: Coherent, Relevant and Intact  Thoughts: normal  Mental Status: Orientation: oriented to person, place, time/date and situation Mood & Affect: normal affect Attention Span & Concentration: Normal  Medical Decision Making (Choose Three): Established Problem, Stable/Improving (1), Review of Last Therapy  Session (1) and Review of Medication Regimen & Side Effects (2)  Assessment: Past Medical History    Diagnosis   Date    .   Physical growth delay       .   Goiter       .   Poor appetite       .   ADHD (attention deficit hyperactivity disorder)       .   Puberty delay       .   Thyroiditis, autoimmune       .   Seizures        Hospitalizations: No., Head Injury: No., Nervous System Infections: No., Immunizations up to date: Yes.    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   Physical growth delay       .   Goiter       .   Poor appetite       .   ADHD (attention deficit hyperactivity disorder)       .   Puberty delay       .   Thyroiditis, autoimmune       .   Seizures           AXIS IV   economic problems, educational problems, housing problems, other psychosocial or environmental problems, problems related to legal system/crime, problems related to social environment, problems with access to Nicholson care services  and problems with primary support group    AXIS V   51-60 moderate symptoms       Plan: Continue current RX           FU 3 months  Court Joy, PA-C 03/06/2015

## 2015-04-10 ENCOUNTER — Ambulatory Visit (INDEPENDENT_AMBULATORY_CARE_PROVIDER_SITE_OTHER): Payer: 59 | Admitting: Pediatrics

## 2015-04-10 ENCOUNTER — Encounter: Payer: Self-pay | Admitting: Pediatrics

## 2015-04-10 VITALS — BP 102/57 | HR 96 | Ht 69.0 in | Wt 137.8 lb

## 2015-04-10 DIAGNOSIS — G40109 Localization-related (focal) (partial) symptomatic epilepsy and epileptic syndromes with simple partial seizures, not intractable, without status epilepticus: Secondary | ICD-10-CM | POA: Diagnosis not present

## 2015-04-10 DIAGNOSIS — F902 Attention-deficit hyperactivity disorder, combined type: Secondary | ICD-10-CM | POA: Diagnosis not present

## 2015-04-10 DIAGNOSIS — F4320 Adjustment disorder, unspecified: Secondary | ICD-10-CM

## 2015-04-10 DIAGNOSIS — F064 Anxiety disorder due to known physiological condition: Secondary | ICD-10-CM

## 2015-04-10 DIAGNOSIS — G40309 Generalized idiopathic epilepsy and epileptic syndromes, not intractable, without status epilepticus: Secondary | ICD-10-CM | POA: Diagnosis not present

## 2015-04-10 MED ORDER — LAMICTAL XR 200 MG PO TB24: ORAL_TABLET | ORAL | Status: DC

## 2015-04-10 NOTE — Progress Notes (Signed)
Patient: Mark Nicholson MRN: [id number] Sex: male DOB: Feb 28, 1996  Provider: Deetta Perla, MD Location of Care: [facility]  Note type: Routine return visit  History of Present Illness: Referral Source: Dr. Kriste Basque History from: mother, patient and [facility] chart Chief Complaint: Seizures  ROEL Nicholson is a 19 y.o. male who returns in followup on April 10, 2015, for the first time since October 04, 2014.  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  His last seizure occurred on September 07, 2014, when he was in a campus dorm room at college.  He was late to take his lamotrigine.  He had adequate sleep the night before.  He had not used alcohol.  Lamotrigine level September 26, 2014, was 12.9 mcg/mL, which is solidly in the therapeutic range.  Fortunately, since his last visit there have been no seizures.    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  He has decided to withdraw from [employer] and attend [employer] in the upcoming year.  This may help improve his grade point average and also he may find a direction of study.  Currently he takes Lamictal XR 200 mg one p.o. b.i.d.  He had no side effects and no seizures.  On April 19, 2015 [employer], his managed care company has said that they will no longer cover Lamictal XR.  We will obtain the paperwork from his family and file a request for prior authorization.  Review of Systems: 12 system review was remarkable for anxiety  Past Medical History Diagnosis Date  . Physical growth delay   . Goiter   . Poor appetite   . Puberty delay   . Thyroiditis, autoimmune   . ADHD (attention deficit hyperactivity disorder)     dx elementary school  . Seizures     1st in 9th grade   Hospitalizations: No., Head Injury: No.,  Nervous System Infections: No., Immunizations up to date: Yes.    Syncope in the fifth grade which caused him to be dazed, motor tic disorder-class in, short stature, delayed puberty, autoimmune thyroiditis with goiter, attention deficit hyperactivity disorder mixed type, osteochondritis dissecans, Lichen striatus. He is followed by Dr. Molli Knock. The patient also vitiligo on his face.  He had a single generalized tonic-clonic seizure on September 22, 2010.  EEG performed on October 02, 2010 showed brief generalized electrographic seizures of four seconds in duration without clinical accompaniments.  EEG on September 27, 2012 was normal.  Seen in the ER at [facility] due to a seizure on 05/25/14.  He presented on September 07, 2014, following a generalized seizure in the campus dorm room. He was late to take his lamotrigine. This was a brief generalized tonic-clonic seizure without warning and with postictal confusion. He had a lamotrigine level September 26, 2014, that was 12.9 mcg/mL.  Birth History 8 lbs. 1 oz. infant born at [redacted] weeks gestational age tube a 19 year old primigravida.  Gestation complicated by a 27 pound weight gain.  Labor lasted for only one hour. Normal spontaneous vaginal delivery. Nursery course was uneventful. Breast feeding over 11 months.  Growth and development was recalled as normal. Nocturnal enuresis until 19 years of age  Behavior History depression  Surgical History Procedure Laterality Date  . Circumcision     Family History family history includes Alcohol abuse  in his maternal grandfather; Diabetes in his paternal aunt; Lung cancer in his maternal grandfather; Seizures in his maternal grandfather and paternal grandfather; Seizures (age of onset: 43) in his father; Thyroid disease in his paternal aunt. There is no history of Cancer. Family history is negative for migraines, seizures, intellectual disabilities, blindness, deafness, birth defects,  chromosomal disorder, or autism.  Social History . Marital Status: Single    Spouse Name: N/A  . Number of Children: N/A  . Years of Education: N/A   Social History Main Topics  . Smoking status: Never Smoker   . Smokeless tobacco: Never Used  . Alcohol Use: No  . Drug Use: No  . Sexual Activity: No   Social History Narrative   Educational level junior college School Attending: [employer]  Occupation: Student  Living with parents and sister   Hobbies/Interest: Enjoys playing video games.   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 He now will attend [employer] and is undeclared in his major.   No Known Allergies  Physical Exam BP 102/57 mmHg  Pulse 96  Ht 5\' 9"  (1.753 m)  Wt 137 lb 12.8 oz (62.506 kg)  BMI 20.34 kg/m2  General: alert, well developed, well nourished, in no acute distress, black hair, brown eyes, left handed Head: normocephalic, no dysmorphic features Ears, Nose and Throat: Otoscopic: tympanic membranes normal; pharynx: oropharynx is pink without exudates or tonsillar hypertrophy Neck: supple, full range of motion, no cranial or cervical bruits Respiratory: auscultation clear Cardiovascular: no murmurs, pulses are normal Musculoskeletal: no skeletal deformities or apparent scoliosis Skin: no rashes or neurocutaneous lesions  Neurologic Exam  Mental Status: alert; oriented to person, place and year; knowledge is normal for age; language is normal Cranial Nerves: visual fields are full to double simultaneous stimuli; extraocular movements are full and conjugate; pupils are round reactive to light; funduscopic examination shows sharp disc margins with normal vessels; symmetric facial strength; midline tongue and uvula; air conduction is greater than bone conduction bilaterally Motor: Normal strength, tone and mass; good fine motor movements; no pronator drift Sensory: intact responses to cold, vibration,  proprioception and stereognosis Coordination: good finger-to-nose, rapid repetitive alternating movements and finger apposition Gait and Station: normal gait and station: patient is able to walk on heels, toes and tandem without difficulty; balance is adequate; Romberg exam is negative; Gower response is negative Reflexes: symmetric and diminished bilaterally; no clonus; bilateral flexor plantar responses  Assessment 1. Generalized convulsive epilepsy, G40.309. 2. 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 3. Attention deficit hyperactivity disorder, combined type, F90.2. 4. Adjustment disorder with problems at school, F43.20. 5. 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  Discussion We will continue Lamictal XR and we will fight to help him keep his trade drug, which seems to have done a good job controlling his seizures over the past seven months.  Plan A prescription was issued for Lamictal XR.  He will return to see me in six months' time.  I spent 30 minutes of face-to-face time with the patient and his mother, more than half of it in consultation.   Medication List   This list is accurate as of: 04/10/15 11:59 AM.       LAMICTAL XR 200 MG Tb24  Generic drug:  LamoTRIgine XR  Take 1 tablet by mouth twice daily     lisdexamfetamine 30 MG capsule  Commonly known as:  VYVANSE  Take 1 capsule (30  mg total) by mouth daily. Do not fill before 02/01/2015     pimecrolimus 1 % cream  Commonly known as:  ELIDEL  Apply 1 application topically 2 (two) times daily.     PREVIDENT 5000 BOOSTER PLUS 1.1 % Pste  Generic drug:  Sodium Fluoride  APPLY A SMALL AMOUNT ON TEETH BEFORE BEDTIME. DO NO RINSE AFTER APPLYING      The medication list was reviewed and reconciled. All changes or newly prescribed medications were explained.  A complete medication list was provided to the patient/caregiver.  Deetta Perla MD

## 2015-04-16 ENCOUNTER — Telehealth: Payer: Self-pay | Admitting: Family Medicine

## 2015-04-16 ENCOUNTER — Encounter: Payer: Self-pay | Admitting: *Deleted

## 2015-04-16 NOTE — Telephone Encounter (Signed)
Mom called to request another referral to a psycologist.  The previous one you reccommended did not work out. Need someone more professional "per mom:"

## 2015-04-16 NOTE — Telephone Encounter (Signed)
Left a message for the pts mother  to return my call. 

## 2015-04-16 NOTE — Telephone Encounter (Signed)
Please provide them the list of psychiatrists. I recommend Dr. Loralie ChampagneMcKinney's office, however, sometimes takes a few months to get a new patient visit.They usually do not take referrals and require pt to contact directly to schedule.Advise follow up with current psychiatrist until establishes with new office.

## 2015-04-17 NOTE — Telephone Encounter (Signed)
I called the pts mother and informed her of the message below and she was given the phone numbers for Emerson MonteParrish McKinney and [facility].

## 2015-04-19 IMAGING — CR DG BONE AGE
1 series · 1 of 1 positions shown · non-contrast
Comparison: None.

EXAM:
BONE AGE DETERMINATION
TECHNIQUE: AP radiographs of the hand and wrist are correlated with the
developmental standards of Greulich and Pyle.

[x hand pa left]
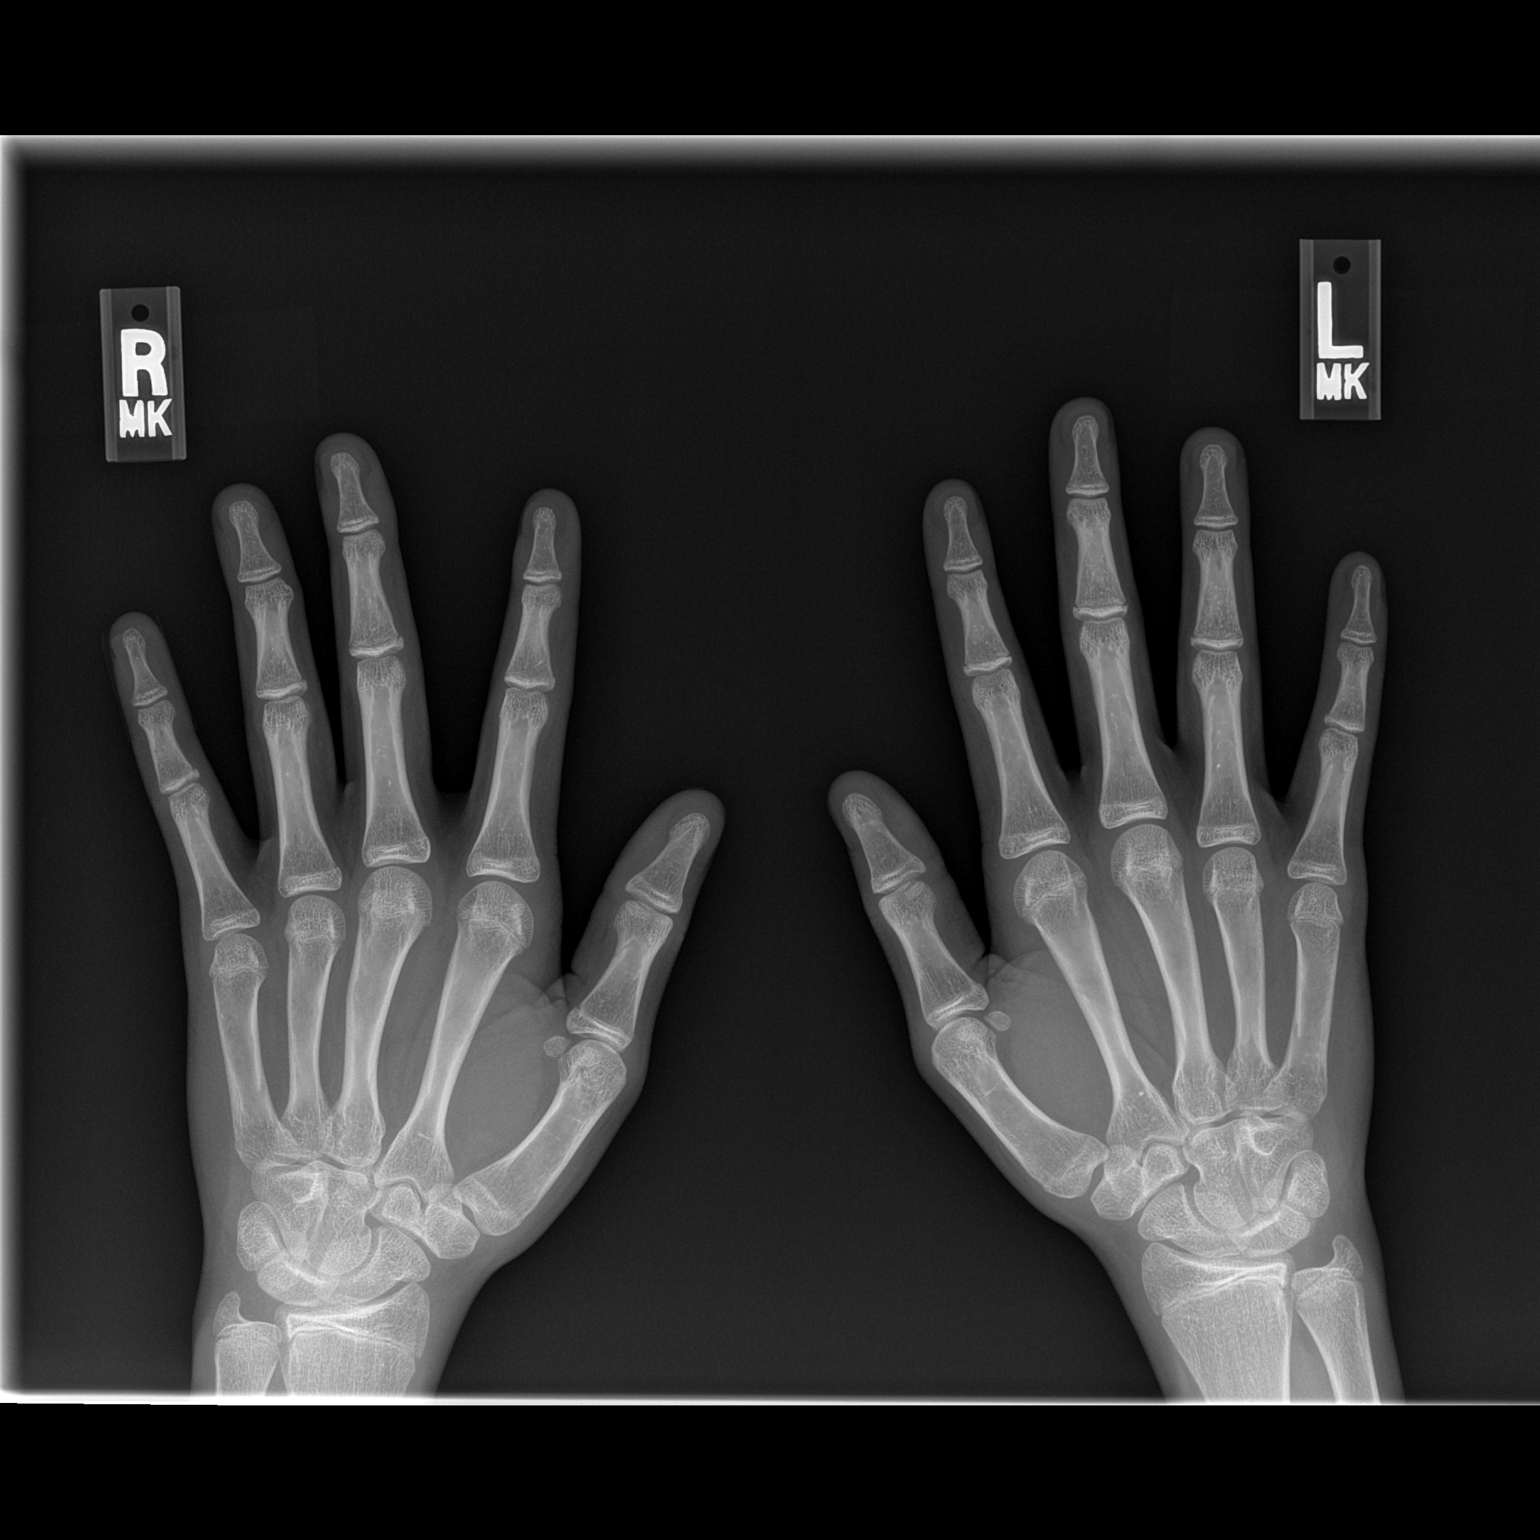

[1 of 1 positions shown; findings below may reference images not displayed]

FINDINGS: Chronologic age:  17 years 4 months (date of birth 10/19/1996)

Bone age: 15 years 0 months; Two standard deviations =+- 30.2 months
IMPRESSION: The patient's skeletal age is below 2 standard deviations of the
bony ridge consistent with delayed bone age.

## 2015-04-29 ENCOUNTER — Telehealth: Payer: Self-pay | Admitting: Family Medicine

## 2015-04-29 NOTE — Telephone Encounter (Signed)
Mom has tried several time to reach [facility] [place]Lugo but the number is non working. She has left two messages for Dr Loralie ChampagneMcKinney's office as well but no one has gotten in touch with her. Would like to know if you suggest anyone else?

## 2015-04-29 NOTE — Telephone Encounter (Signed)
Ronnald CollumJo Anne, can you please call Dr. Loralie ChampagneMcKinney's office to see if pt has contacted them and to see if they can return patient's call? Then let pt know. Thank you.

## 2015-04-30 NOTE — Telephone Encounter (Signed)
I called Dr Loralie ChampagneMcKinney's office and spoke with Mardelle MatteAndy and she stated they have not received any messages on this pt and verified the number we gave the pt and I advised her it is the same number from the pamphlet we give pts.  She was given the pts mother's name and home phone number and stated she will call her for an appt.

## 2015-05-09 NOTE — Telephone Encounter (Signed)
Please close this encounter. I do not have security to do so.

## 2015-05-15 ENCOUNTER — Encounter (HOSPITAL_COMMUNITY): Payer: Self-pay | Admitting: Psychology

## 2015-05-15 DIAGNOSIS — F4320 Adjustment disorder, unspecified: Secondary | ICD-10-CM

## 2015-05-15 DIAGNOSIS — F902 Attention-deficit hyperactivity disorder, combined type: Secondary | ICD-10-CM

## 2015-05-15 NOTE — Progress Notes (Signed)
Mark Nicholson is a 19 y.o. male patient discharged as last seen on 11/15/14. Outpatient Therapist Discharge Summary  Mark Nicholson    10-12-1996   Admission Date: 11/15/14   Discharge Date:  05/15/15 Reason for Discharge:   parent informed seeking counseling with another provider.  Diagnosis:    Attention deficit hyperactivity disorder (ADHD), combined type  Adjustment disorder with problems at school    Comments:  On 11/21/14 parent cancelled f/u informing that seeking care elsewhere.  Alfredo Batty, LPC

## 2015-05-23 ENCOUNTER — Telehealth: Payer: Self-pay | Admitting: Family Medicine

## 2015-05-23 NOTE — Telephone Encounter (Signed)
Faxed referral authorization to [phone] REFERRAL completed for  [facility]: Rocco Serene MD  [address] [place], [place] [address]   [phone] Confirmation the  referral case information was transmitted on 05/23/2015 at 12:19 PM CDT Your Referral Number is [id number]

## 2015-06-05 ENCOUNTER — Other Ambulatory Visit (HOSPITAL_COMMUNITY): Payer: Self-pay

## 2015-06-05 ENCOUNTER — Other Ambulatory Visit (HOSPITAL_COMMUNITY): Payer: Self-pay | Admitting: Medical

## 2015-06-05 DIAGNOSIS — F908 Attention-deficit hyperactivity disorder, other type: Secondary | ICD-10-CM

## 2015-06-05 MED ORDER — LISDEXAMFETAMINE DIMESYLATE 30 MG PO CAPS: 30.0000 mg | ORAL_CAPSULE | Freq: Every day | ORAL | Status: DC

## 2015-06-05 NOTE — Telephone Encounter (Signed)
Met with Darlyne Russian, PA-C who reported patient would need to be seen for additional Vyvnase orders but patient could reschedule within the next 30 days and he would provide enough until that appointment time.  Spoke with Ms. Klemmer who reported patient would need a later appointment due to his class schedule.  Arranged to move patient to 3pm on 06/12/15 but patient will also run out one day earlier than the appointment.  Ms. Buening requested a refill to get patient through until his appointment for patient's Vyvanse.  Agreed to send request to Darlyne Russian, PA-C and will contact her back once prescription prepared.

## 2015-06-05 NOTE — Telephone Encounter (Signed)
Telephone call with patient's Mother after she had left a message requesting 3 months worth of prescriptions refills for patient's prescribed Vyvanse as stated she was not sure patient would be able to keep upcoming appointment on 06/12/15 due to he had already restarted school.  Ms. Moening reported patient cannot drive still and it is difficult to get him in due to his schedule and her having to coordinate a time to bring him in.  Informed typically patient's on stimulants are seen every 3 months and that this nurse is not sure [name], PA-C will be willing to write patient another 3 months worth of prescriptions without being seen.  Agreed to send request to PA as Ms. Gillie stated understanding but requested he at least do another refill until she could work out a time for September if that is what he preferred.  Agreed to send request to PA and would contact her back with instruction.

## 2015-06-05 NOTE — Telephone Encounter (Signed)
Telephone message left to inform patient's new Vyvanse prescription was prepared for pick up.  Informed of need to keep appointment 06/12/15 at 3pm for further refills.

## 2015-06-07 ENCOUNTER — Telehealth (HOSPITAL_COMMUNITY): Payer: Self-pay

## 2015-06-07 NOTE — Telephone Encounter (Signed)
Mark Nicholson, mother picked up prescription on 1/61/09  Lic [id number]  dlo

## 2015-06-12 ENCOUNTER — Ambulatory Visit (INDEPENDENT_AMBULATORY_CARE_PROVIDER_SITE_OTHER): Payer: 59 | Admitting: Medical

## 2015-06-12 ENCOUNTER — Encounter (HOSPITAL_COMMUNITY): Payer: Self-pay | Admitting: Medical

## 2015-06-12 VITALS — BP 114/67 | HR 81 | Ht 70.0 in | Wt 138.0 lb

## 2015-06-12 DIAGNOSIS — F4320 Adjustment disorder, unspecified: Secondary | ICD-10-CM

## 2015-06-12 DIAGNOSIS — E063 Autoimmune thyroiditis: Secondary | ICD-10-CM

## 2015-06-12 DIAGNOSIS — F900 Attention-deficit hyperactivity disorder, predominantly inattentive type: Secondary | ICD-10-CM

## 2015-06-12 DIAGNOSIS — F064 Anxiety disorder due to known physiological condition: Secondary | ICD-10-CM

## 2015-06-12 DIAGNOSIS — G40309 Generalized idiopathic epilepsy and epileptic syndromes, not intractable, without status epilepticus: Secondary | ICD-10-CM

## 2015-06-12 MED ORDER — LISDEXAMFETAMINE DIMESYLATE 30 MG PO CAPS: 30.0000 mg | ORAL_CAPSULE | Freq: Every day | ORAL | Status: DC

## 2015-06-12 MED ORDER — VYVANSE 30 MG PO CAPS: 30.0000 mg | ORAL_CAPSULE | Freq: Every day | ORAL | Status: DC

## 2015-06-12 MED ORDER — VYVANSE 30 MG PO CAPS
30.0000 mg | ORAL_CAPSULE | Freq: Every day | ORAL | Status: DC
Start: 2015-06-12 — End: 2015-07-29

## 2015-06-12 NOTE — Progress Notes (Signed)
[facility]  Progress Note  Mark Nicholson [id number] 19 y.o.  06/12/2015 5:56 PM  Chief Complaint:    ADHD predominately inattentive type-Followup/Med Management   Adjustment disorder with problems at school resolvecd   Epilepsy-no seizures since Dec 15,2015 on medication followed by Dr Benna Dunks   History of Present Illness:  Pt is 19 year old AAM, with h/o ADHD. Pt has h/o seizures, and is on lamotrigine LA 250 mg hs. In addition, he has h/o vitiligo with associated anxiety and problems at [facility] now resolved Pt is on vyvanse 30 mg po and is doing well on it. Sleeping and eating are better. He denies any depression, anxiety, mania, or psychosis. He denies SI/HI/AVH. Calm, cooperative, during interview.concentration is good. Noted to have white patches on his face, and is on a topical cream.Doing better at school and has changed Major to Gaming programming. He has 1 course on Campus and the reamainegr are online at home .Marland KitchenMom came with him but did not attend visit. He does not any longer feel the need for counseling..  Suicidal Ideation: Negative Plan Formed: NA Patient has means to carry out plan: NA  Homicidal Ideation: Negative Plan Formed: NA Patient has means to carry out plan: NA  Review of Systems:   Constitutional: The patient feels "good". He seems healthy and active. Eyes: Vision seems to be good with his current eye glasses or contacts. There are no recognized eye problems. Neck: The patient has no complaints of anterior neck swelling, soreness, tenderness, pressure, discomfort, or difficulty swallowing.   Heart: Heart rate increases with exercise or other physical activity. The patient has no complaints of palpitations, irregular heart beats, chest pain, or chest pressure.    Gastrointestinal: Bowel movents seem normal. The patient has no complaints of excessive hunger, acid reflux, upset stomach, stomach aches or pains, diarrhea, or constipation.   Legs: Muscle  mass and strength seem normal. There are no complaints of numbness, tingling, burning, or pain. No edema is noted.   Feet: There are no obvious foot problems. There are no complaints of numbness, tingling, burning, or pain. No edema is noted. Chest:  Breast are flat now.   Skin: He feels that his vitiligo is better after beginning UV light therapy at [facility]. He has the UV therapy 3 times per week.   Psychiatric: Agitation: Negative Hallucination: Negative Depressed Mood: Denies Insomnia: No Hypersomnia: Negative Altered Concentration: Negative Feels Worthless: No Grandiose Ideas: No Belief In Special Powers: No New/Increased Substance Abuse: No Compulsions: No Neurologic: Headache: Negative Seizure: Yes Paresthesias: Negative  Past Medical Family, Social History:   Past Medical History    Diagnosis   Date    .   Physical growth delay       .   Goiter       .   Poor appetite       .   ADHD (attention deficit hyperactivity disorder)       .   Puberty delay       .   Thyroiditis, autoimmune       .   Seizures         ADDITIONAL  PAST HISTORY 1. 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    2. Gynecomastia: His breast buds have disappeared. His areolae are flat. His gynecomastia has essentially resolved. We do not need to follow this problem any longer.      3. Pubertal  delay: His puberty has progressed nicely.    4. Decreased appetite/weight loss: These problems have resolved. 5. Vitiligo: The vitiligo seems to be improving.   6. Goiter: His thyroid gland is larger and softer today. These findings imply less inflammation by Hashimoto's disease WBCs, but could also occur if the TSIs are higher. He was euthyroid in April. He will probably become hypothyroid in the next 5 years.  His TFTs have been mid-range normal since December 2013. He is clinically euthyroid today.   7. Hashimoto's Disease: The  presence of positive antibodies in the setting of a goiter that waxes and wanes and his positive family history all suggest that Mark Nicholson will eventually develop hypothyroidism. At present, however, the thyroiditis is clinically quiescent. .He started freshman year in college, at [employer] in [place] of 2015 to study Dance movement psychotherapist.His 1st semester did not go well. He flunked multiple subjects Calculus,Philosophy,Chemistry and is on academic probation.Mom wants him to get counseling but he is reluctant.His problems dont stem from attention deficit,rather from his inability to understand and his reluctance to ask for help/reveal his weakness.He appears to have had a reluctance to seek on campus help due to his epilepsy and vitiligo which already make him "different" in the eyes of his peers.He did see counselor [name] 1 time at [facility] but didnt feel need to continue with counseling.   Hospitalizations: No., Head Injury: No., Nervous System Infections: No., Immunizations up to date: Yes.    Allergies:NO KNOWN ALLERGIES   Substance Abuse History in the last 12 months: none  Social History: Current Place of Residence: [place] Place of Birth:  1996/03/12 Family Members: parents, and sister, age 20   Children: na             Sons: na             Daughters: na Relationships: none He started freshman year in college, at [employer] in [place] of 2015 to study Dance movement psychotherapist.His 1st semester did not go well. He flunked multiple subjects Calculus,Philosophy,Chemistry and is on academic probation.Mom wants him to get counseling but he is reluctant.His problems dont stem from attention deficit,rather from his inability to understand and his reluctance to ask for help/reveal his weakness.He appears to have had a reluctance to seek on campus help due to his epilepsy and vitiligo which already make him "different" in the eyes of his peers.He did see counselor [name] 1 time at  [facility] but didnt feel need to continue with counseling.   Developmental History: Physical and puberty delay Prenatal History:   Birth History:   Postnatal Infancy:   Developmental History:   Milestones:  Sit-Up:    Crawl:   Walk:   Speech: School History:    Freshman year of college, going to [employer], will study Psychologist, sport and exercise History: The patient has no significant history of legal issues. Hobbies/Interests: computers   Family History:    Family History   Problem  Relation  Age of Onset   .  Diabetes  Paternal Aunt     .  Thyroid disease  Paternal Aunt     .  Cancer  Neg Hx     .  Seizures  Father  15       6-13 yo unresponsive staring, generalized clonic   .  Lung cancer  Maternal Grandfather     .  Seizures  Maternal Grandfather  Related to alcohol use and sleep deprivation. treated w phenobarbital   .  Alcohol abuse  Maternal Grandfather     .  Seizures  Paternal Grandfather         Outpatient Encounter Prescriptions as of 06/12/2015  Medication Sig  . LAMICTAL XR 200 MG TB24 Take 1 tablet by mouth twice daily  . LAMICTAL XR 200 MG TB24   . LAMICTAL XR 200 MG TB24   . lisdexamfetamine (VYVANSE) 30 MG capsule Take 1 capsule (30 mg total) by mouth daily. No further refills without being seen  . lisdexamfetamine (VYVANSE) 30 MG capsule Take 1 capsule (30 mg total) by mouth daily. Do not fill before 09/03/2015  . pimecrolimus (ELIDEL) 1 % cream Apply 1 application topically 2 (two) times daily.  Marland Kitchen PREVIDENT 5000 BOOSTER PLUS 1.1 % PSTE APPLY A SMALL AMOUNT ON TEETH BEFORE BEDTIME. DO NO RINSE AFTER APPLYING  . PREVIDENT 5000 BOOSTER PLUS 1.1 % PSTE   . VYVANSE 30 MG capsule Take 1 capsule (30 mg total) by mouth daily. Do not fill before 07/04/2015  . VYVANSE 30 MG capsule Take 1 capsule (30 mg total) by mouth daily. Do not fill before 08/03/2015  . [DISCONTINUED] VYVANSE 30 MG capsule   . [DISCONTINUED] VYVANSE 30 MG capsule    No  facility-administered encounter medications on file as of 06/12/2015.    Past Psychiatric History/Hospitalization(s)      Past Psychiatric History: Diagnosis: ADHD   Hospitalizations:  no   Outpatient Care:  yes   Substance Abuse Care:  no   Self-Mutilation:  no   Suicidal Attempts:  no   Violent Behaviors:  no    Anxiety: Negative Bipolar Disorder: Negative Depression: Negative Mania: Negative Psychosis: Negative Schizophrenia: Negative Personality Disorder: Negative Hospitalization for psychiatric illness: Negative History of Electroconvulsive Shock Therapy: Negative Prior Suicide Attempts: Negative  Physical Exam: Constitutional:  BP 114/67 mmHg  Pulse 81  Ht  (1.778 m)  Wt 138 lb (62.596 kg)  BMI 19.80 kg/m2  General Appearance: alert, oriented, no acute distress and well nourished  Musculoskeletal: Strength & Muscle Tone: within normal limits Gait & Station: normal Patient leans: N/A  Psychiatric: Speech (describe rate, volume, coherence, spontaneity, and abnormalities if any): Normal and comprehensible  Thought Process (describe rate, content, abstract reasoning, and computation): WDL  Associations: Coherent, Relevant and Intact  Thoughts: normal  Mental Status: Orientation: oriented to person, place, time/date and situation Mood & Affect: normal affect Attention Span & Concentration: Normal  Medical Decision Making (Choose Three): Established Problem, Stable/Improving (1), Review of Last Therapy Session (1) and Review of Medication Regimen & Side Effects (2)  ASSESSMENT: DSM 5 Adjustment disorder with problems at school resolved;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   Physical growth delay       .   Goiter       .   Poor appetite       .   ADHD (attention deficit hyperactivity disorder)        .   Puberty delay       .   Thyroiditis, autoimmune       .   Seizures           AXIS IV  economic problems, educational problems, housing problems, other psychosocial or environmental problems, problems related to legal system/crime, problems related to social environment, problems with access to health care services and problems with primary support group    AXIS V   51-60 moderate symptoms     Medical Decision Making (Choose Three): Established Problem, Stable/Improving (1), Review of Last Therapy Session (1) and Review of Medication Regimen & Side Effects (2)  Plan: Continue current RX           FU 3 months  [name], PA-C 06/12/2015

## 2015-06-14 ENCOUNTER — Telehealth: Payer: Self-pay | Admitting: Family Medicine

## 2015-06-14 NOTE — Telephone Encounter (Signed)
Referral submission completed  On 03-14-2015 :15 pm . Your referral case information was transmitted on 06/14/2015 at 02:13 PM CDT Your Referral Number is [id number]  For pt to see [facility]: Rocco Serene MD  [address] [place], [place], [place] [address]  [phone]

## 2015-07-29 ENCOUNTER — Ambulatory Visit: Payer: 59 | Admitting: Family Medicine

## 2015-07-29 ENCOUNTER — Ambulatory Visit (INDEPENDENT_AMBULATORY_CARE_PROVIDER_SITE_OTHER): Payer: 59 | Admitting: Family Medicine

## 2015-07-29 ENCOUNTER — Encounter (INDEPENDENT_AMBULATORY_CARE_PROVIDER_SITE_OTHER): Payer: 59 | Admitting: Family Medicine

## 2015-07-29 ENCOUNTER — Encounter: Payer: Self-pay | Admitting: Family Medicine

## 2015-07-29 VITALS — BP 114/74 | HR 97 | Temp 98.2°F | Wt 139.9 lb

## 2015-07-29 DIAGNOSIS — M546 Pain in thoracic spine: Secondary | ICD-10-CM

## 2015-07-29 DIAGNOSIS — Z23 Encounter for immunization: Secondary | ICD-10-CM

## 2015-07-29 NOTE — Progress Notes (Signed)
Pre visit review using our clinic review tool, if applicable. No additional management support is needed unless otherwise documented below in the visit note. 

## 2015-07-29 NOTE — Patient Instructions (Signed)
BEFORE YOU LEAVE: -see if he would like his flu and Tdap vaccines -upper back exercises -follow up appointment in 1 month  Do the exercises at least 4 days per week  Gentle aerobic activity such as walking, joggin, etc at least 30 minutes 4-5 days per week  Topical sports creams as needed for pain

## 2015-07-29 NOTE — Progress Notes (Signed)
HPI:  Mark Nicholson is a 19 yo M whom I have not seen in some time here for an acute visit for back pain:  Mid-upper bilat back pain: -chronic, but worse the last few weeks -intermittent, occurs with lifting something heavy or a lot of bending over, then resolves on its own -report is "not bad" pain, not interfering with activities -denies: malaise, fevers, weakness, numbness, persistent pain, hx of trauma -does not get any regular exercise, mother reports spends a lot of time sitting stooped over looking at screen  ROS: See pertinent positives and negatives per HPI.  Past Medical History  Diagnosis Date  . Physical growth delay     sees endo  . Goiter     sees endo  . Poor appetite   . Puberty delay   . Thyroiditis, autoimmune     sees endo  . ADHD (attention deficit hyperactivity disorder)     dx elementary school, sees psychiatrist  . Seizures (HCC)     1st in 9th grade, sees neurologist    Past Surgical History  Procedure Laterality Date  . Circumcision      Family History  Problem Relation Age of Onset  . Diabetes Paternal Aunt   . Thyroid disease Paternal Aunt   . Cancer Neg Hx   . Seizures Father 73    6-13 yo unresponsive staring, generalized clonic  . Lung cancer Maternal Grandfather   . Seizures Maternal Grandfather     Related to alcohol use and sleep deprivation. treated w phenobarbital  . Alcohol abuse Maternal Grandfather   . Seizures Paternal Grandfather     Social History   Social History  . Marital Status: Single    Spouse Name: N/A  . Number of Children: N/A  . Years of Education: N/A   Social History Main Topics  . Smoking status: Never Smoker   . Smokeless tobacco: Never Used  . Alcohol Use: No  . Drug Use: No  . Sexual Activity: No   Other Topics Concern  . 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   Has been accepted to [employer], on waiting list at [employer]. 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           Current outpatient prescriptions:  .  LAMICTAL XR 200 MG TB24, Take 1 tablet by mouth twice daily, Disp: 62 tablet, Rfl: 5 .  LAMICTAL XR 200 MG TB24, , Disp: , Rfl:  .  LAMICTAL XR 200 MG TB24, , Disp: , Rfl:  .  lisdexamfetamine (VYVANSE) 30 MG capsule, Take 1 capsule (30 mg total) by mouth daily. Do not fill before 09/03/2015, Disp: 30 capsule, Rfl: 0 .  pimecrolimus (ELIDEL) 1 % cream, Apply 1 application topically 2 (two) times daily., Disp: , Rfl:   EXAM:  There were no vitals filed for this visit.  There is no weight on file to calculate BMI.  Vitals: BP 114/74, pulse 97, temp 98.2, weight 139lbs and 14 ounces  GENERAL: vitals reviewed and listed above, alert, oriented, appears well hydrated and in no acute distress  HEENT: atraumatic, conjunttiva clear, no obvious abnormalities on inspection of external nose and ears  NECK: no obvious masses on inspection  LUNGS: clear to auscultation bilaterally, no wheezes, rales or rhonchi, good air movement  CV:  HRRR, no peripheral edema  MS: moves all extremities without noticeable abnormality, normal gait, shoulders forward posture, no bony TTP of the spin, mild TTP in bilat thoracic and cervical paraspinal muscles, normal strength throughout  PSYCH: pleasant and cooperative, no obvious depression or anxiety  ASSESSMENT AND PLAN:  Discussed the following assessment and plan:  Bilateral thoracic back pain  -we discussed possible serious and likely etiologies, workup and treatment, treatment risks and return precautions - likely muscle soreness /strain related to posture and activity level -after this discussion, [name] opted for HEP, regular exercise -follow up advised in 1 month to recheck -of course, we advised [name]  to return or notify a doctor immediately if symptoms worsen or  persist or new concerns arise.  -Patient advised to return or notify a doctor immediately if symptoms worsen or persist or new concerns arise.  Patient Instructions  BEFORE YOU LEAVE: -see if he would like his flu and Tdap vaccines -upper back exercises -follow up appointment in 1 month  Do the exercises at least 4 days per week  Gentle aerobic activity such as walking, joggin, etc at least 30 minutes 4-5 days per week  Topical sports creams as needed for pain       Krithika Tome R.

## 2015-07-30 NOTE — Progress Notes (Signed)
This encounter was created in error - please disregard.

## 2015-08-09 ENCOUNTER — Telehealth: Payer: Self-pay | Admitting: Family Medicine

## 2015-08-09 NOTE — Telephone Encounter (Signed)
Referral completed for pt to see Dr drew Yetta BarreJones  Confirmation Thank you for your online Referral submission. Your referral case information was transmitted on 08/09/2015 at 03:07 PM CDT Your Referral Number is [id number] faxed authorization to their office

## 2015-08-12 ENCOUNTER — Telehealth: Payer: Self-pay | Admitting: *Deleted

## 2015-08-12 NOTE — Telephone Encounter (Signed)
Called mother and left a voicemail letting her know that I was unclear on what she needed from Dr. Sharene SkeansHickling. I left her my information and invited her to call me back.

## 2015-08-12 NOTE — Telephone Encounter (Signed)
Patient's mother called and left a voicemail. It was very hard to understand but she talks about it being time to get a letter to get Mark Nicholson's driver's license in November.  CB: [phone]

## 2015-08-15 NOTE — Telephone Encounter (Signed)
I received a message from Mom yesterday afternoon at 1232PM. I attempted to call her back but did not receive an answer. I tried again this morning and again did not receive an answer. It appears that if Tinnie GensJeffrey wants to apply for a license, we will need to complete a DMV form (that they will provide to him after he applies). There is no need for a letter to [facility]. I will try to reach Mom again later today. TG

## 2015-08-19 NOTE — Telephone Encounter (Signed)
Patient's mother called back requesting to be reached at [phone]

## 2015-08-19 NOTE — Telephone Encounter (Signed)
I called Mom and explained about the medical review form that is supplied by [facility]. She had no further questions. TG

## 2015-08-19 NOTE — Telephone Encounter (Signed)
Noted, thank you

## 2015-09-02 ENCOUNTER — Encounter: Payer: Self-pay | Admitting: Pediatrics

## 2015-09-04 ENCOUNTER — Telehealth: Payer: Self-pay | Admitting: Family Medicine

## 2015-09-04 NOTE — Telephone Encounter (Signed)
I called Mom to let her know that [facility] completed and that she could pick it up during office hours. TG

## 2015-09-04 NOTE — Telephone Encounter (Signed)
Referral has been submitted on 09/04/2015 for pt to see Mark Nicholson   Confirmation Thank you for your online Referral submission. Your referral case information was transmitted on 09/04/2015 at 01:35 PM CST Your Referral Number is [id number]

## 2015-09-09 ENCOUNTER — Ambulatory Visit: Payer: Self-pay | Admitting: "Endocrinology

## 2015-09-23 ENCOUNTER — Telehealth: Payer: Self-pay | Admitting: Family Medicine

## 2015-09-23 NOTE — Telephone Encounter (Signed)
Confirmation Thank you for your online Referral submission. Your referral case information was transmitted on 09/23/2015 at 03:53 PM CST Your Referral Number is [id number]  for pt to see  [facility]: Rocco SereneJones Drew A MD   Address: [address], [place], [place] [address] Phone: [phone]

## 2015-10-02 ENCOUNTER — Encounter (HOSPITAL_COMMUNITY): Payer: Self-pay | Admitting: Psychiatry

## 2015-10-02 ENCOUNTER — Ambulatory Visit (INDEPENDENT_AMBULATORY_CARE_PROVIDER_SITE_OTHER): Payer: 59 | Admitting: Psychiatry

## 2015-10-02 ENCOUNTER — Ambulatory Visit (HOSPITAL_COMMUNITY): Payer: Self-pay | Admitting: Medical

## 2015-10-02 VITALS — BP 134/67 | HR 87 | Ht 69.25 in | Wt 138.0 lb

## 2015-10-02 DIAGNOSIS — F900 Attention-deficit hyperactivity disorder, predominantly inattentive type: Secondary | ICD-10-CM | POA: Diagnosis not present

## 2015-10-02 MED ORDER — LISDEXAMFETAMINE DIMESYLATE 30 MG PO CAPS: 30.0000 mg | ORAL_CAPSULE | Freq: Every day | ORAL | Status: DC

## 2015-10-02 NOTE — Progress Notes (Signed)
[facility]  Progress Note  Mark Nicholson [id number] 19 y.o.  10/02/2015 1:49 PM  Diagnosis.    ADHD predominately inattentive type-Followup/Med Management   Adjustment disorder with problems at school resolvecd   Epilepsy-no seizures since Dec 15,2015 on medication followed by Dr Benna DunksHinkley    Subjective I'm doing well    History of Present Illness:Pt seen for the first time by Dr. Rutherford Limerickadepalli, he is a transfer from [facility]  Pt is 19 year old AAM, with h/o ADHD. Pt has h/o seizures, and is on lamotrigine LA 250 mg hs. In addition, he has h/o vitiligo with associated [facility] and [facility] now [facility] Pt is on vyvanse 30 mg po and is doing well on it. Sleeping and eating are better. He denies any depression, anxiety, mania, or psychosis. He denies SI/HI/AVH. Calm, cooperative, during interview.concentration is good. Noted to have white patches on his face, and is on a topical cream.Doingwell at school and has changed Major to Gaming programming. He has 1 course on Campus and the reamainegr are online at home .Marland Kitchen.Mom came with him but did not attend visit. He does not any longer feel the need for counseling..  Suicidal Ideation: Negative Plan Formed: NA Patient has means to carry out plan: NA  Homicidal Ideation: Negative Plan Formed: NA Patient has means to carry out plan: NA  Review of Systems:   Constitutional: The patient feels "good". He seems healthy and active. Eyes: Vision seems to be good with his current eye glasses or contacts. There are no recognized eye problems. Neck: The patient has no complaints of anterior neck swelling, soreness, tenderness, pressure, discomfort, or difficulty swallowing.   Heart: Heart rate increases with exercise or other physical activity. The patient has no complaints of palpitations, irregular heart beats, chest pain, or chest pressure.    Gastrointestinal: Bowel movents seem normal. The patient has no complaints of  excessive hunger, acid reflux, upset stomach, stomach aches or pains, diarrhea, or constipation.   Legs: Muscle mass and strength seem normal. There are no complaints of numbness, tingling, burning, or pain. No edema is noted.   Feet: There are no obvious foot problems. There are no complaints of numbness, tingling, burning, or pain. No edema is noted. Chest:  Breast are flat now.   Skin: He feels that his vitiligo is better after [facility]. He has the UV therapy 3 times per week.   Psychiatric: Agitation: Negative Hallucination: Negative Depressed Mood: Denies Insomnia: No Hypersomnia: Negative Altered Concentration: Negative Feels Worthless: No Grandiose Ideas: No Belief In Special Powers: No New/Increased Substance Abuse: No Compulsions: No Neurologic: Headache: Negative Seizure: Yes Paresthesias: Negative  Past Medical Family, Social History:   Past Medical History    Diagnosis   Date    .   Physical growth delay       .   Goiter       .   Poor appetite       .   ADHD (attention deficit hyperactivity disorder)       .   Puberty delay       .   Thyroiditis, autoimmune       .   Seizures         ADDITIONAL  PAST HISTORY 1. 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    2. Gynecomastia: His breast buds have disappeared. His areolae are flat. His  gynecomastia has essentially resolved. We do not need to follow this problem any longer.      3. Pubertal delay: His puberty has progressed nicely.    4. Decreased appetite/weight loss: These problems have resolved. 5. Vitiligo: The vitiligo seems to be improving.   6. Goiter: His thyroid gland is larger and softer today. These findings imply less inflammation by Hashimoto's disease WBCs, but could also occur if the TSIs are higher. He was euthyroid in April. He will probably become hypothyroid in the next 5 years.  His TFTs  have been mid-range normal since December 2013. He is clinically euthyroid today.   7. Hashimoto's Disease: The presence of positive antibodies in the setting of a goiter that waxes and wanes and his positive family history all suggest that [name] will eventually develop hypothyroidism. At present, however, the thyroiditis is clinically quiescent. .He started freshman year in college, at [employer] in [place] of 2015 to study Dance movement psychotherapist.His 1st semester did not go well. He flunked multiple subjects Calculus,Philosophy,Chemistry and is on academic probation.Mom wants him to get counseling but he is reluctant.His problems dont stem from attention deficit,rather from his inability to understand and his reluctance to ask for help/reveal his weakness.He appears to have had a reluctance to seek on campus help due to his epilepsy and vitiligo which already make him "different" in the eyes of his peers.He did see counselor [name] 1 time at [facility] but didnt feel need to continue with counseling.   Hospitalizations: No., Head Injury: No., Nervous System Infections: No., Immunizations up to date: Yes.    Allergies:NO KNOWN ALLERGIES   Substance Abuse History in the last 12 months: none  Social History: Current Place of Residence: [place] Place of Birth:  [birth year] Family Members: parents, and sister, age 71   Children: na             Sons: na             Daughters: na Relationships: none    Developmental History: Physical and puberty delay Prenatal History:   Birth History:   Postnatal Infancy:   Developmental History:   Milestones:  Sit-Up:    Crawl:   Walk:   Speech: School History:    Freshman year of college, and [employer] Legal History: The patient has no significant history of legal issues. Hobbies/Interests: computers   Family History:    Family History   Problem  Relation  Age of Onset   .  Diabetes  Paternal Aunt     .  Thyroid disease  Paternal Aunt     .   Cancer  Neg Hx     .  Seizures  Father  50       6-13 yo unresponsive staring, generalized clonic   .  Lung cancer  Maternal Grandfather     .  Seizures  Maternal Grandfather         Related to alcohol use and sleep deprivation. treated w phenobarbital   .  Alcohol abuse  Maternal Grandfather     .  Seizures  Paternal Grandfather         Outpatient Encounter Prescriptions as of 10/02/2015  Medication Sig  . LAMICTAL XR 200 MG TB24 Take 1 tablet by mouth twice daily  . LAMICTAL XR 200 MG TB24   . LAMICTAL XR 200 MG TB24   . lisdexamfetamine (VYVANSE) 30 MG capsule Take 1 capsule (30 mg total) by mouth daily. Do  not fill before 09/03/2015  . pimecrolimus (ELIDEL) 1 % cream Apply 1 application topically 2 (two) times daily.   No facility-administered encounter medications on file as of 10/02/2015.    Past Psychiatric History/Hospitalization(s)      Past Psychiatric History: Diagnosis: ADHD   Hospitalizations:  no   Outpatient Care:  yes   Substance Abuse Care:  no   Self-Mutilation:  no   Suicidal Attempts:  no   Violent Behaviors:  no    Anxiety: Negative Bipolar Disorder: Negative Depression: Negative Mania: Negative Psychosis: Negative Schizophrenia: Negative Personality Disorder: Negative Hospitalization for psychiatric illness: Negative History of Electroconvulsive Shock Therapy: Negative Prior Suicide Attempts: Negative  Physical Exam: Constitutional:  BP 134/67 mmHg  Pulse 87  Ht 5' 9.25" (1.759 m)  Wt 138 lb (62.596 kg)  BMI 20.23 kg/m2  General Appearance: alert, oriented, no acute distress and well nourished  Musculoskeletal: Strength & Muscle Tone: within normal limits Gait & Station: normal Patient leans: N/A  Psychiatric: Speech (describe rate, volume, coherence, spontaneity, and abnormalities if any): Normal and comprehensible  Thought Process (describe rate, content, abstract reasoning, and computation): WDL  Associations: Coherent, Relevant  and Intact  Thoughts: normal  Mental Status: Alert, Orientation: oriented to person, place, time/date and situation Mood & Affect: normal affect Attention Span & Concentration: Normal Suicidal ideation none  homicidal ideation none Medical Decision Making (Choose Three): Established Problem, Stable/Improving (1), Review of Last Therapy Session (1) and Review of Medication Regimen & Side Effects (2)  ASSESSMENT: DSM 5 ;;ADHD predominately inattentive type  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   Physical growth delay       .   Goiter       .   Poor appetite       .   ADHD (attention deficit hyperactivity disorder)       .   Puberty delay       .   Thyroiditis, autoimmune       .   Seizures                  Medical Decision Making (Choose Three): Established Problem, Stable/Improving (1), Review of Last Therapy Session (1) and Review of Medication Regimen & Side Effects (2)  Plan: ADHD inattentive type Continue Vyvanse 30 mg by mouth every morning Seizure disorder Is being treated with Lamictal by Dr. [name] Labs none during this visit. Therapy patient refuses to see a therapist Patient will return to see me in the clinic in 3 months or call sooner if necessary. This visit lasted 30 minutes of high intensity as more than 50% of the time was spent in counseling discussing med compliance and medications also discussed trying to schedule his day and organize it so that it'll be helpful for him, coping skills and action alternatives to impulsivity and developing self soothing behaviors. Identifying triggers and organizing himself. Interpersonal and supportive therapy was provided.        Margit Banda, MD 10/02/2015

## 2015-10-08 ENCOUNTER — Ambulatory Visit (INDEPENDENT_AMBULATORY_CARE_PROVIDER_SITE_OTHER): Payer: 59 | Admitting: Pediatrics

## 2015-10-08 ENCOUNTER — Encounter: Payer: Self-pay | Admitting: Pediatrics

## 2015-10-08 VITALS — BP 110/60 | HR 84 | Ht 69.0 in | Wt 135.8 lb

## 2015-10-08 DIAGNOSIS — G40109 Localization-related (focal) (partial) symptomatic epilepsy and epileptic syndromes with simple partial seizures, not intractable, without status epilepticus: Secondary | ICD-10-CM | POA: Diagnosis not present

## 2015-10-08 DIAGNOSIS — G40309 Generalized idiopathic epilepsy and epileptic syndromes, not intractable, without status epilepticus: Secondary | ICD-10-CM | POA: Diagnosis not present

## 2015-10-08 MED ORDER — LAMICTAL XR 200 MG PO TB24: ORAL_TABLET | ORAL | Status: DC

## 2015-10-08 NOTE — Progress Notes (Signed)
Patient: Mark Nicholson MRN: [id number] Sex: male DOB: [birth year]  Provider: Deetta PerlaHICKLING,WILLIAM H, MD [facility]  Note type: Routine return visit  History of Present Illness: Referral Source: Dr. Kriste BasqueHannah Kim History from: [facility], [facility] and mother Chief Complaint: Generalized convulsive epilepsy   Mark Nicholson is a 19 y.o. male who was evaluated October 08, 2015 for first time since April 10, 2015.  He has localization related and generalized convulsive epilepsy that is well controlled.  He also has attention deficit disorder combined type.  He attends [employer] and is studying stimulation games.  He had been in [employer], but had difficulty maintaining the program.  He takes Lamictal XR 200 mg twice daily without side effects.  We have been able to work with his managed care to see that he receives trade drug.  [name]'s health has been good.  His weight is stable.  He has normal appetite.  He is sleeping well.  No concerns were raised by Tinnie GensJeffrey or his mother today.  Review of Systems: 12 system review was unremarkable  Past Medical History Diagnosis Date  . Physical growth delay     sees endo  . Goiter     sees endo  . Poor appetite   . Puberty delay   . Thyroiditis, autoimmune     sees endo  . ADHD (attention deficit hyperactivity disorder)     dx elementary school, sees psychiatrist  . Seizures (HCC)     1st in 9th grade, sees neurologist   Hospitalizations: No., Head Injury: No., Nervous System Infections: No., Immunizations up to date: Yes.    Nocturnal enuresis until 19 years of age.  Syncope in the fifth grade which caused him to be dazed, motor tic disorder-class in, short stature, delayed puberty, autoimmune thyroiditis with goiter, attention deficit hyperactivity disorder mixed type, osteochondritis dissecans, Lichen striatus. He is followed by Dr. Molli KnockMichael Brennan. The patient also  vitiligo on his face.  He had a single generalized tonic-clonic seizure on September 22, 2010.  EEG performed on October 02, 2010 showed brief generalized electrographic seizures of four seconds in duration without clinical accompaniments.  EEG on September 27, 2012 was normal.  [facility] on 05/25/14.  He presented on September 07, 2014, following a generalized seizure in the campus dorm room. He was late to take his lamotrigine. This was a brief generalized tonic-clonic seizure without warning and with postictal confusion. He had a lamotrigine level September 26, 2014, that was 12.9 mcg/mL.  Birth History 8 lbs. 1 oz. infant born at 240 weeks gestational age tube a 19 year old primigravida.  Gestation complicated by a 27 pound weight gain.  Labor lasted for only one hour. Normal spontaneous vaginal delivery. Nursery course was uneventful. Breast feeding over 11 months.  Growth and development was recalled as normal.   Behavior History ADHD, depression  Surgical History Procedure Laterality Date  . Circumcision     Family History family history includes Alcohol abuse in his maternal grandfather; Diabetes in his paternal aunt; Lung cancer in his maternal grandfather; Seizures in his maternal grandfather and paternal grandfather; Seizures (age of onset: 406) in his father; Thyroid disease in his paternal aunt. There is no history of Cancer. Family history is negative for migraines, intellectual disabilities, blindness, deafness, birth defects, chromosomal disorder, or autism.  Social History . Marital Status: Single    Spouse Name: N/A  .  Number of Children: N/A  . Years of Education: N/A   Social History Main Topics  . Smoking status: Never Smoker   . Smokeless tobacco: Never Used  . Alcohol Use: No  . Drug Use: No  . 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 He is Glass blower/designer in [employer], [employer].  He enjoys playing video games and walking.     Lives with this parent and younger sister.    No Known Allergies  Physical Exam BP 110/60 mmHg  Pulse 84  Ht  (1.753 m)  Wt 135 lb 12.8 oz (61.598 kg)  BMI 20.04 kg/m2  General: alert, well developed, well nourished, in no acute distress, black hair, brown eyes, left handed Head: normocephalic, no dysmorphic features Ears, Nose and Throat: Otoscopic: tympanic membranes normal; pharynx: oropharynx is pink without exudates or tonsillar hypertrophy Neck: supple, full range of motion, no cranial or cervical bruits Respiratory: auscultation clear Cardiovascular: no murmurs, pulses are normal Musculoskeletal: no skeletal deformities or apparent scoliosis Skin: no rashes or neurocutaneous lesions  Neurologic Exam  Mental Status: alert; oriented to person, place and year; knowledge is normal for age; language is normal Cranial Nerves: visual fields are full to double simultaneous stimuli; extraocular movements are full and conjugate; pupils are round reactive to light; funduscopic examination shows sharp disc margins with normal vessels; symmetric facial strength; midline tongue and uvula; air conduction is greater than bone conduction bilaterally Motor: Normal strength, tone and mass; good fine motor movements; no pronator drift Sensory: intact responses to cold, vibration, proprioception and stereognosis Coordination: good finger-to-nose, rapid repetitive alternating movements and finger apposition Gait and Station: normal gait and station: patient is able to walk on heels, toes and tandem without difficulty; balance is adequate; Romberg exam is negative; Gower response is negative Reflexes: symmetric and diminished bilaterally; no clonus; bilateral flexor plantar responses  Assessment 1. Generalized convulsive epilepsy, G40.309. 2. 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  Plan Continue Lamictal as  his current treatment.  He will return to see me in six months' time sooner depending upon clinical need.  I spent 30 minutes of face-to-face time with Hailey and his mother, more than half of it in consultation.   Medication List   This list is accurate as of: 10/08/15 11:16 AM.       LAMICTAL XR 200 MG Tb24  Generic drug:  LamoTRIgine XR  Take 1 tablet by mouth twice daily     lisdexamfetamine 30 MG capsule  Commonly known as:  VYVANSE  Take 1 capsule (30 mg total) by mouth daily.     pimecrolimus 1 % cream  Commonly known as:  ELIDEL  Apply 1 application topically 2 (two) times daily as needed.      The medication list was reviewed and reconciled. All changes or newly prescribed medications were explained.  A complete medication list was provided to the patient/caregiver.  Deetta Perla MD

## 2015-10-18 ENCOUNTER — Ambulatory Visit: Payer: 59 | Admitting: Pediatrics

## 2015-11-04 ENCOUNTER — Telehealth (HOSPITAL_COMMUNITY): Payer: Self-pay

## 2015-11-04 NOTE — Telephone Encounter (Signed)
Patients mother called, she went to pick up vyvanse rx and it needs prior [place]. Patient has new insurance and we do not have a copy os the card yet. I called Cala BradfordKimberly patients mother back and she stated she will bring a copy by tomorrow and I will start on the auth.

## 2015-11-06 ENCOUNTER — Telehealth (HOSPITAL_COMMUNITY): Payer: Self-pay

## 2015-11-06 NOTE — Telephone Encounter (Signed)
This was submitted on cover my meds and was approved from 11/06/2015 - 10/18/2038

## 2015-11-06 NOTE — Telephone Encounter (Signed)
Patients mother came in this morning to bring his new insurance card so that I could start the prior auth on his vyvanse. This Berkley Harvey was started on Cover My Meds, the key is [id number]

## 2015-11-20 LAB — TSH: TSH: 1.565 u[IU]/mL (ref 0.350–4.500)

## 2015-11-20 LAB — T4, FREE: FREE T4: 1.1 ng/dL (ref 0.80–1.80)

## 2015-11-20 LAB — T3, FREE: T3 FREE: 3.2 pg/mL (ref 2.3–4.2)

## 2015-11-20 LAB — FOLLICLE STIMULATING HORMONE: FSH: 14.3 m[IU]/mL (ref 1.4–18.1)

## 2015-11-20 LAB — LUTEINIZING HORMONE: LH: 5.7 m[IU]/mL (ref 1.5–9.3)

## 2015-11-25 ENCOUNTER — Telehealth: Payer: Self-pay | Admitting: *Deleted

## 2015-11-25 DIAGNOSIS — G40309 Generalized idiopathic epilepsy and epileptic syndromes, not intractable, without status epilepticus: Secondary | ICD-10-CM

## 2015-11-25 DIAGNOSIS — G40109 Localization-related (focal) (partial) symptomatic epilepsy and epileptic syndromes with simple partial seizures, not intractable, without status epilepticus: Secondary | ICD-10-CM

## 2015-11-25 MED ORDER — LAMICTAL XR 200 MG PO TB24: ORAL_TABLET | ORAL | Status: DC

## 2015-11-25 NOTE — Telephone Encounter (Signed)
Mom notified Rx is ready and will be at the front desk for pick up.

## 2015-11-25 NOTE — Telephone Encounter (Signed)
Prescription was signed and delivered to your desk.

## 2015-11-25 NOTE — Telephone Encounter (Signed)
Mom called and lvm stating that they are in need of a printed prescription for Mark Nicholson's Lamictal XR . She states that they are trying to obtain patient assistance for this medication and they are asking her to fax in a prescription for this. She would like a call to let her know when this is ready for pick up.   CB: [phone]

## 2015-11-27 ENCOUNTER — Ambulatory Visit (INDEPENDENT_AMBULATORY_CARE_PROVIDER_SITE_OTHER): Payer: BLUE CROSS/BLUE SHIELD | Admitting: "Endocrinology

## 2015-11-27 ENCOUNTER — Encounter: Payer: Self-pay | Admitting: "Endocrinology

## 2015-11-27 VITALS — BP 114/69 | HR 94 | Wt 134.4 lb

## 2015-11-27 DIAGNOSIS — L80 Vitiligo: Secondary | ICD-10-CM | POA: Diagnosis not present

## 2015-11-27 DIAGNOSIS — E300 Delayed puberty: Secondary | ICD-10-CM

## 2015-11-27 DIAGNOSIS — E049 Nontoxic goiter, unspecified: Secondary | ICD-10-CM

## 2015-11-27 DIAGNOSIS — R6250 Unspecified lack of expected normal physiological development in childhood: Secondary | ICD-10-CM | POA: Diagnosis not present

## 2015-11-27 DIAGNOSIS — E063 Autoimmune thyroiditis: Secondary | ICD-10-CM | POA: Diagnosis not present

## 2015-11-27 NOTE — Progress Notes (Signed)
Subjective:  Patient Name: Mark Nicholson "Mark Nicholson Date of Birth: [birth year]  MRN: [id number]  Mark Nicholson  presents to the office today for follow-up evaluation and management of his delayed puberty, poor weight gain, growth delay, gynecomastia, vitiligo, seizures, ADD, and short stature.  HISTORY OF PRESENT ILLNESS:   Mark Nicholson is a 20 y.o. African-American young man.  Mark Nicholson was unaccompanied.  1. Mark Nicholson was first seen in our clinic on 01/28/09 after referral by his primary care provider, Dr. Randell Loop of [facility] for evaluation and management of growth delay in association with taking medication for ADHD. The patient was then 13-1/2.  He reportedly had previously been at about the 50% on both the height growth curve and weight growth curve. His appetite decreased markedly on ADD medications. His growth fell off as well. The patient was only hungry at the very end of the day. He was started on cyproheptadine and liberalization of his diet, and responded well. He did try stopping the cyproheptadine but his weight fell.  He also had a delayed start to puberty secondary to poor weight gain and poor growth. Since neither he nor his mother wanted him to take cyproheptadine, I agreed in May 2014 to stop the cyproheptadine as long as he continued to eat and to grow. His appetite did increase later and his growth in weight and height increased accordingly.  2. Mark Nicholson's last [facility] visit was on 02/17/14.   A. In the interim, he has been pretty healthy. His appetite is pretty good. He feels that his vitiligo is improving quite a bit. He has not had any further seizures since his last visit. He remains on Lamictal and  Vyvanse.  B. He has been followed for pubertal delay. Pubic hair, axillary hair, and genitalia are all increasing in amount/size. Voice is deeper.   3. Pertinent Review of Systems:  Constitutional: The patient feels "good". He seems healthy and active. Eyes: Vision seems  to be good with his current eye glasses or contacts. There are no recognized eye problems. Neck: The patient has no complaints of anterior neck swelling, soreness, tenderness, pressure, discomfort, or difficulty swallowing.  Heart: Heart rate increases with exercise or other physical activity. The patient has no complaints of palpitations, irregular heart beats, chest pain, or chest pressure.   Gastrointestinal: Bowel movents seem normal. The patient has no complaints of excessive hunger, acid reflux, upset stomach, stomach aches or pains, diarrhea, or constipation.  Legs: Muscle mass and strength seem normal. There are no complaints of numbness, tingling, burning, or pain. No edema is noted.  Feet: There are no obvious foot problems. There are no complaints of numbness, tingling, burning, or pain. No edema is noted. Neurologic: There are no recognized problems with muscle movement and strength, sensation, or coordination. Chest:  Breast are flat now.  Skin: He feels that his vitiligo is better after beginning UV light therapy at [facility]. He has the UV therapy 2-3 times per week.    PAST MEDICAL, FAMILY, AND SOCIAL HISTORY  Past Medical History  Diagnosis Date  . Physical growth delay     sees endo  . Goiter     sees endo  . Poor appetite   . Puberty delay   . Thyroiditis, autoimmune     sees endo  . ADHD (attention deficit hyperactivity disorder)     dx elementary school, sees psychiatrist  . Seizures (HCC)     1st in 9th grade, sees neurologist    Family  History  Problem Relation Age of Onset  . Diabetes Paternal Aunt   . Thyroid disease Paternal Aunt   . Cancer Neg Hx   . Seizures Father 92    6-13 yo unresponsive staring, generalized clonic  . Lung cancer Maternal Grandfather   . Seizures Maternal Grandfather     Related to alcohol use and sleep deprivation. treated w phenobarbital  . Alcohol abuse Maternal Grandfather   . Seizures Paternal Grandfather       Current outpatient prescriptions:  .  LAMICTAL XR 200 MG TB24, Take 1 tablet by mouth twice daily, Disp: 62 tablet, Rfl: 5 .  lisdexamfetamine (VYVANSE) 30 MG capsule, Take 1 capsule (30 mg total) by mouth daily., Disp: 30 capsule, Rfl: 0 .  pimecrolimus (ELIDEL) 1 % cream, Apply 1 application topically 2 (two) times daily as needed. Reported on 11/27/2015, Disp: , Rfl:   Allergies as of 11/27/2015  . (No Known Allergies)     reports that he has never smoked. He has never used smokeless tobacco. He reports that he does not drink alcohol or use illicit drugs. Pediatric History  Patient Guardian Status  . Mother:  Mark Nicholson, Mark Nicholson  . Father:  Mark Nicholson,Mark Nicholson   Other Topics Concern  . 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 He enjoys playing video games and walking.    Lives with this parent and younger sister.         1. School and family: He is a Medical laboratory scientific officer at [employer] and is taking on-line classes. He wants to study simulation gaming.  2. Activities: He is not doing much for physical activity. He wants to re-learn how to play the saxophone again.  3. Primary Care Provider: Terressa Koyanagi., DO, [facility], [place] 4. Neurology: Dr. [name]  REVIEW OF SYSTEMS: There are no other significant problems involving Mark Nicholson's other body systems.   Objective:  Vital Signs:  BP 114/69 mmHg  Pulse 94  Wt 134 lb 6.4 oz (60.963 kg)   Ht Readings from Last 3 Encounters:  10/08/15  (1.753 m) (41 %*, Z = -0.22)  10/02/15 5' 9.25" (1.759 m) (45 %*, Z = -0.13)  06/12/15  (1.778 m) (56 %*, Z = 0.14)   * Growth percentiles are based on CDC 2-20 Years data.   Wt Readings from Last 3 Encounters:  11/27/15 134 lb 6.4 oz (60.963 kg)  10/08/15 135 lb 12.8 oz (61.598 kg) (18 %*, Z = -0.90)  10/02/15 138 lb (62.596 kg) (22 %*, Z = -0.78)   * Growth percentiles are based on CDC 2-20 Years data.    HC Readings from Last 3 Encounters:  No data found for [facility]   There is no height on file to calculate BSA. Facility age limit for growth percentiles is 20 years. Facility age limit for growth percentiles is 20 years.    PHYSICAL EXAM:  Constitutional: The patient appears healthy, but slender.  His height growth has ceased. He has lost 1.5 pounds in the past 9 months. He is bright and alert.  Head: The head is normocephalic. Face: His vitiligo is better. He has more pink areas and brown areas and no pure white areas. The pigment is slowly returning toward normal.  Eyes: The eyes appear to be normally formed and spaced. Gaze is conjugate. There is no obvious arcus or proptosis. Moisture appears normal. Ears: The ears are normally placed  and appear externally normal. Mouth: The oropharynx and tongue appear normal. Dentition appears to be normal for age. Oral moisture is normal. Neck: The neck appears to be visibly normal. No carotid bruits are noted. The strap muscles are appropriately enlarged. The thyroid gland is probably  smaller at about 22 grams in size. The right lobe is at the upper limit of normal size today. The left lobe is enlarged again at about the same size that it was at his last visit. The consistency of the right thyroid lobe is normal, but the left lobe is still "relatively full". The thyroid gland is not tender to palpation. He has 1+ acanthosis. Lungs: The lungs are clear to auscultation. Air movement is good. Heart: Heart rate and rhythm are regular. Heart sounds S1 and S2 are normal. I did not appreciate any pathologic cardiac murmurs. Abdomen: The abdomen is normal in size for the patient's age. Bowel sounds are normal. There is no obvious hepatomegaly, splenomegaly, or other mass effect.  Arms: Muscle size and bulk are normal for age. Hands: There is no obvious tremor. Phalangeal and metacarpophalangeal joints are normal. Palmar muscles are normal for age. Palmar skin is  normal. Palmar moisture is also normal. Legs: Muscles appear normal for age. No edema is present. Neurologic: Strength is normal for age in both the upper and lower extremities. Muscle tone is normal. Sensation to touch is normal in both legs.    LAB DATA:  11/27/15: Testosterone is pending.  11/20/15: TSH 1.564, free T4 1.10, free T3 3.2; LH 5.7, FSH 14.3;    02/13/15: LH 8.5, FSH 15.2, testosterone 311  02/12/14: TSH 1.060, Free T4 1.13, free T3 3.4  07/19/13: TSH 0.756, free T4 1.19, free T3 3.6  02/13/13: TSH 1.333, free T4 0.98, free T3 3.2  10/03/12: TSH 1.285, free T4 1.34, free T3 3.5   12/28/11: TSH 2.154, free T4 1.18, free T3 3.6  IMAGING: Bone age on 02/20/13: BA 14 at CA 17. I reviewed the study. The BA was actually 14.5 years.   Assessment and Plan:   ASSESSMENT:  1. Growth delay,physical: His height growth has ceased, c/w his age. He has lost 1.5 pounds since his last visit.   2. Pubertal delay: His puberty had progressed nicely clinically.  We need to see his testosterone, FSH, and LH to assess his puberty from a hormonal viewpoint.   3. Decreased appetite/weight loss: His appetite is good, but he has lost 1.5 pounds since his last visit. He says that he is not trying to lose weight or to gain weight.   4. Vitiligo: The vitiligo continues to improve.   5. Goiter: His thyroid gland is smaller today. He is mid-euthyroid this month. It is unclear whether or not he will ewer become hypothyroid.   6. Hashimoto's Disease: The presence of positive antibodies in the setting of a goiter that waxes and wanes and his positive family history all suggest that Mark Nicholson will eventually develop hypothyroidism. At present, however, the thyroiditis is clinically quiescent.  PLAN:  1. Diagnostic: Testosterone now.  LH, FSH, and testosterone prior to next visit.  2. Therapeutic: Eat normally. Try to fit in more exercise. 3. Patient education: Discussed growth, vitiligo, puberty, and  autoimmune diseases.  4. Follow-up: 6 months   Level of Service: This visit lasted in excess of 45 minutes. More than 50% of the visit was devoted to counseling.  David Stall, MD

## 2015-11-27 NOTE — Patient Instructions (Signed)
Follow up visit in 6 months. Please repeat lab tests one week prior.  

## 2015-11-28 LAB — TESTOSTERONE, FREE, TOTAL, SHBG
SEX HORMONE BINDING: 22 nmol/L (ref 10–50)
TESTOSTERONE-% FREE: 2.5 % (ref 1.6–2.9)
TESTOSTERONE: 330 ng/dL (ref 250–827)
Testosterone, Free: 81.2 pg/mL (ref 47.0–244.0)

## 2015-11-28 LAB — TESTOS,TOTAL,FREE AND SHBG (FEMALE)

## 2015-12-02 ENCOUNTER — Telehealth: Payer: Self-pay | Admitting: *Deleted

## 2015-12-02 DIAGNOSIS — G40109 Localization-related (focal) (partial) symptomatic epilepsy and epileptic syndromes with simple partial seizures, not intractable, without status epilepticus: Secondary | ICD-10-CM

## 2015-12-02 DIAGNOSIS — G40309 Generalized idiopathic epilepsy and epileptic syndromes, not intractable, without status epilepticus: Secondary | ICD-10-CM

## 2015-12-02 MED ORDER — LAMOTRIGINE ER 200 MG PO TB24: ORAL_TABLET | ORAL | Status: DC

## 2015-12-02 NOTE — Telephone Encounter (Signed)
Patient's mother called and left a voicemail stating that she needs [place] to call [facility] pharmacy on file and approve for [name] to receive generic Lamictal XR . She states that the insurance is not willing to pay brand name and want her to pay $900 out of pocket. The pharmacy advised her that in order to change this to generic, which the insurance company will pay for she will need the provider to contact them.  CB: [phone]

## 2015-12-02 NOTE — Telephone Encounter (Signed)
I talked with [employer] and with the pharmacy. The cost of the medication before insurance was applied was over $1600. With insurance applied, the cost was reduced to $900. [employer] no longer offers coupon discount cards for this medication. I tried using a GoodRx discount and the cost was over $400. I talked with the pharmacy and sent in an Rx for generic Lamotrigine ER  - 1 tablet BID. TG

## 2015-12-02 NOTE — Telephone Encounter (Signed)
Matt from [facility] lvm stating that patient has a Rx for Lamicatal XR 200 mg. Brand name is $[id number]. Patient coupon card is no longer valid. Matt asked if we had a coupon card to give patient or if he should fill for generic. Patient could not find a coupon on-line. CB # [phone].

## 2015-12-02 NOTE — Telephone Encounter (Signed)
I submitted a request to [facility] for prior authorization of the Lamictal XR . If they do not approve it, we can consider change to generic. TG

## 2015-12-02 NOTE — Telephone Encounter (Addendum)
See additional phone note from today. I tried to get the cost lowered by appealing to her insurance and by using a GoodRx card to no avail. The manufacturer no longer offers a discount card for brand Lamictal XR. I sent in an Rx for generic Lamotrigine ER . TG

## 2015-12-02 NOTE — Telephone Encounter (Signed)
Note reviewed, and I agree with this plan.

## 2015-12-10 ENCOUNTER — Encounter: Payer: Self-pay | Admitting: *Deleted

## 2015-12-30 ENCOUNTER — Other Ambulatory Visit (HOSPITAL_COMMUNITY): Payer: Self-pay

## 2015-12-30 DIAGNOSIS — F900 Attention-deficit hyperactivity disorder, predominantly inattentive type: Secondary | ICD-10-CM

## 2015-12-30 NOTE — Telephone Encounter (Signed)
Patient sees Dr. Rutherford Limerickadepalli and had to move his appointment out until the end of the month. Patient is out of Vyvanse, okay to refill for 1 month? Please advise, thank you

## 2015-12-31 ENCOUNTER — Telehealth (HOSPITAL_COMMUNITY): Payer: Self-pay

## 2015-12-31 MED ORDER — LISDEXAMFETAMINE DIMESYLATE 30 MG PO CAPS: 30.0000 mg | ORAL_CAPSULE | Freq: Every day | ORAL | Status: DC

## 2015-12-31 NOTE — Telephone Encounter (Signed)
Cala BradfordKimberly, mom picked up prescription on 4/54/093/14/17  lic [phone]  dlo

## 2015-12-31 NOTE — Telephone Encounter (Signed)
Spoke with Dr. Ladona Ridgelaylor and he approved patients Vyvanse for one month.

## 2016-01-01 ENCOUNTER — Ambulatory Visit (HOSPITAL_COMMUNITY): Payer: Self-pay | Admitting: Psychiatry

## 2016-01-13 ENCOUNTER — Other Ambulatory Visit: Payer: Self-pay | Admitting: *Deleted

## 2016-01-13 MED ORDER — CYPROHEPTADINE HCL 4 MG PO TABS: 4.0000 mg | ORAL_TABLET | Freq: Two times a day (BID) | ORAL | Status: DC

## 2016-01-15 ENCOUNTER — Encounter (HOSPITAL_COMMUNITY): Payer: Self-pay | Admitting: Psychiatry

## 2016-01-15 ENCOUNTER — Ambulatory Visit (INDEPENDENT_AMBULATORY_CARE_PROVIDER_SITE_OTHER): Payer: BLUE CROSS/BLUE SHIELD | Admitting: Psychiatry

## 2016-01-15 VITALS — BP 127/69 | HR 90 | Ht 70.0 in | Wt 154.0 lb

## 2016-01-15 DIAGNOSIS — F900 Attention-deficit hyperactivity disorder, predominantly inattentive type: Secondary | ICD-10-CM | POA: Diagnosis not present

## 2016-01-15 DIAGNOSIS — E063 Autoimmune thyroiditis: Secondary | ICD-10-CM

## 2016-01-15 DIAGNOSIS — G40309 Generalized idiopathic epilepsy and epileptic syndromes, not intractable, without status epilepticus: Secondary | ICD-10-CM

## 2016-01-15 MED ORDER — LISDEXAMFETAMINE DIMESYLATE 30 MG PO CAPS: 30.0000 mg | ORAL_CAPSULE | Freq: Every day | ORAL | Status: DC

## 2016-01-15 NOTE — Progress Notes (Signed)
[facility]  Progress Note  Mark BladeJeffrey D Haislip [id number] 20 y.o.  01/15/2016 9:06 AM  Diagnosis.    ADHD predominately inattentive type-Followup/Med Management   Adjustment disorder with problems at school resolvecd   Epilepsy-no seizures since Dec 15,2015 on medication followed by Dr Benna DunksHinkley    Subjective I'm doing well  History of Present Illness:--- Patient seen today for medication follow-up states he has been doing well. Tolerating his medications well and is coping well. Continues to see Dr. Sharene SkeansHickling for his seizure disorder. Patient had his last seizure a year ago.  States that he's doing well on the Vyvanse, he has started working at [employer] and states that he likes it.  Reports that his sleep is good appetite is good mood has been stable. Denies feeling hopeless helpless denies feeling anxious no hallucinations or delusions patient is tolerating his medications well and is coping well.        History from the initial visit with Dr. Rutherford Limerickadepalli  Pt is 20 year old AAM, with h/o ADHD. Pt has h/o seizures, and is on lamotrigine LA 250 mg hs. In addition, he has h/o vitiligo with associated [facility] and [facility] now resolved Pt is on vyvanse 30 mg po and is doing well on it. Sleeping and eating are better. He denies any depression, anxiety, mania, or psychosis. He denies SI/HI/AVH. Calm, cooperative, during interview.concentration is good. Noted to have white patches on his face, and is on a topical cream.Doingwell at school and has changed Major to Gaming programming. He has 1 course on Campus and the reamainegr are online at home .Marland Kitchen.Mom came with him but did not attend visit. He does not any longer feel the need for counseling..  Suicidal Ideation: Negative Plan Formed: NA Patient has means to carry out plan: NA  Homicidal Ideation: Negative Plan Formed: NA Patient has means to carry out plan: NA  Review of Systems:   Constitutional: The patient feels  "good". He seems healthy and active. Eyes: Vision seems to be good with his current eye glasses or contacts. There are no recognized eye problems. Neck: The patient has no complaints of anterior neck swelling, soreness, tenderness, pressure, discomfort, or difficulty swallowing.   Heart: Heart rate increases with exercise or other physical activity. The patient has no complaints of palpitations, irregular heart beats, chest pain, or chest pressure.    Gastrointestinal: Bowel movents seem normal. The patient has no complaints of excessive hunger, acid reflux, upset stomach, stomach aches or pains, diarrhea, or constipation.   Legs: Muscle mass and strength seem normal. There are no complaints of numbness, tingling, burning, or pain. No edema is noted.   Feet: There are no obvious foot problems. There are no complaints of numbness, tingling, burning, or pain. No edema is noted. Chest:  Breast are flat now.   Skin: He feels that his vitiligo is better after [facility]. He has the UV therapy 3 times per week.   Psychiatric: Agitation: Negative Hallucination: Negative Depressed Mood: Denies Insomnia: No Hypersomnia: Negative Altered Concentration: Negative Feels Worthless: No Grandiose Ideas: No Belief In Special Powers: No New/Increased Substance Abuse: No Compulsions: No Neurologic: Headache: Negative Seizure: Yes Paresthesias: Negative  Past Medical Family, Social History:   Past Medical History    Diagnosis   Date    .   Physical growth delay       .   Goiter       .   Poor  appetite       .   ADHD (attention deficit hyperactivity disorder)       .   Puberty delay       .   Thyroiditis, autoimmune       .   Seizures         ADDITIONAL  PAST HISTORY 1. 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    2. Gynecomastia: His breast buds have disappeared. His areolae are flat.  His gynecomastia has essentially resolved. We do not need to follow this problem any longer.      3. Pubertal delay: His puberty has progressed nicely.    4. Decreased appetite/weight loss: These problems have resolved. 5. Vitiligo: The vitiligo seems to be improving.   6. Goiter: His thyroid gland is larger and softer today. These findings imply less inflammation by Hashimoto's disease WBCs, but could also occur if the TSIs are higher. He was euthyroid in April. He will probably become hypothyroid in the next 5 years.  His TFTs have been mid-range normal since December 2013. He is clinically euthyroid today.   7. Hashimoto's Disease: The presence of positive antibodies in the setting of a goiter that waxes and wanes and his positive family history all suggest that Mark Nicholson will eventually develop hypothyroidism. At present, however, the thyroiditis is clinically quiescent. 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 He flunked multiple subjects Calculus,Philosophy,Chemistry and is on academic probation.Mom wants him to get counseling but he is reluctant.His problems dont stem from attention deficit,rather from his inability to understand and his reluctance to ask for help/reveal his weakness.He appears to have had a reluctance to seek on campus help due to his epilepsy and vitiligo which already make him "different" in the eyes of his peers.He did see counselor [name] 1 time at [facility] but didnt feel need to continue with counseling.   Hospitalizations: No., Head Injury: No., Nervous System Infections: No., Immunizations up to date: Yes.    Allergies:NO KNOWN ALLERGIES   Substance Abuse History in the last 12 months: none  Social History: Current Place of Residence: [place] Place of Birth:  08-13-1996 Family Members: parents, and sister, age 76   Children: na             Sons: na              Daughters: na Relationships: none    Developmental History: Physical and puberty delay Prenatal History:   Birth History:   Postnatal Infancy:   Developmental History:   Milestones:  Sit-Up:    Crawl:   Walk:   Speech: School History:    Freshman year of college, and [employer] Legal History: The patient has no significant history of legal issues. Hobbies/Interests: computers   Family History:    Family History   Problem  Relation  Age of Onset   .  Diabetes  Paternal Aunt     .  Thyroid disease  Paternal Aunt     .  Cancer  Neg Hx     .  Seizures  Father  85       6-13 yo unresponsive staring, generalized clonic   .  Lung cancer  Maternal Grandfather     .  Seizures  Maternal Grandfather         Related to  alcohol use and sleep deprivation. treated w phenobarbital   .  Alcohol abuse  Maternal Grandfather     .  Seizures  Paternal Grandfather         Outpatient Encounter Prescriptions as of 01/15/2016  Medication Sig  . cyproheptadine (PERIACTIN) 4 MG tablet Take 1 tablet (4 mg total) by mouth 2 (two) times daily.  . LamoTRIgine XR 200 MG TB24 Take 1 tablet by mouth twice per day  . lisdexamfetamine (VYVANSE) 30 MG capsule Take 1 capsule (30 mg total) by mouth daily.  . pimecrolimus (ELIDEL) 1 % cream Apply 1 application topically 2 (two) times daily as needed. Reported on 11/27/2015   No facility-administered encounter medications on file as of 01/15/2016.    Past Psychiatric History/Hospitalization(s)      Past Psychiatric History: Diagnosis: ADHD   Hospitalizations:  no   Outpatient Care:  yes   Substance Abuse Care:  no   Self-Mutilation:  no   Suicidal Attempts:  no   Violent Behaviors:  no    Anxiety: Negative Bipolar Disorder: Negative Depression: Negative Mania: Negative Psychosis: Negative Schizophrenia: Negative Personality Disorder: Negative Hospitalization for psychiatric illness: Negative History of Electroconvulsive Shock Therapy:  Negative Prior Suicide Attempts: Negative  Physical Exam: Constitutional:  BP 127/69 mmHg  Pulse 90  Ht  (1.778 m)  Wt 154 lb (69.854 kg)  BMI 22.10 kg/m2  General Appearance: alert, oriented, no acute distress and well nourished  Musculoskeletal: Strength & Muscle Tone: within normal limits Gait & Station: normal Patient leans: N/A  Psychiatric: Speech (describe rate, volume, coherence, spontaneity, and abnormalities if any): Normal and comprehensible  Thought Process (describe rate, content, abstract reasoning, and computation): WDL  Associations: Coherent, Relevant and Intact  Thoughts: normal  Mental Status: Alert, Orientation: oriented to person, place, time/date and situation Mood & Affect: normal affect Attention Span & Concentration: Normal Suicidal ideation none  homicidal ideation none Medical Decision Making (Choose Three): Established Problem, Stable/Improving (1), Review of Last Therapy Session (1) and Review of Medication Regimen & Side Effects (2)  ASSESSMENT: DSM 5 ;;ADHD predominately inattentive type  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   Physical growth delay       .   Goiter       .   Poor appetite       .   ADHD (attention deficit hyperactivity disorder)       .   Puberty delay       .   Thyroiditis, autoimmune       .   Seizures                  Medical Decision Making (Choose Three): Established Problem, Stable/Improving (1), Review of Last Therapy Session (1) and Review of Medication Regimen & Side Effects (2)  Plan: ADHD inattentive type Continue Vyvanse 30 mg by mouth every morning Seizure disorder Is being treated with Lamictal by Dr. [name] Labs none during this visit. Therapy patient refuses to see a therapist  Discussed with the patient that I would be leaving the clinic and that he can follow-up with Dr. Michae Kava and he stated  understanding. Patient will return to see Dr. Michae Kava in the clinic in 3 months or call sooner if necessary.  Visit was 25 minutes minutes of high intensity as more than 50% of  the time was spent in counseling discussing med compliance and medications also discussed trying to schedule his day and organize it so that it'll be helpful for him, coping skills and action alternatives to impulsivity and developing self soothing behaviors. Identifying triggers and organizing himself. Interpersonal and supportive therapy was provided.        Margit Banda, MD 01/15/2016

## 2016-02-17 ENCOUNTER — Other Ambulatory Visit (HOSPITAL_COMMUNITY): Payer: Self-pay

## 2016-02-17 DIAGNOSIS — F900 Attention-deficit hyperactivity disorder, predominantly inattentive type: Secondary | ICD-10-CM

## 2016-02-17 MED ORDER — LISDEXAMFETAMINE DIMESYLATE 30 MG PO CAPS: 30.0000 mg | ORAL_CAPSULE | Freq: Every day | ORAL | Status: DC

## 2016-02-17 NOTE — Telephone Encounter (Signed)
Patients mother is calling for refill on Vyvanse, per Dr. Rutherford Limerickadepalli I printed out a prescription for 1 month - called patients mother and let her know that it was ready.

## 2016-02-24 ENCOUNTER — Telehealth (HOSPITAL_COMMUNITY): Payer: Self-pay

## 2016-02-24 NOTE — Telephone Encounter (Signed)
Mark BattenKim, Mark Nicholson  lic [phone]  dlo

## 2016-03-23 ENCOUNTER — Telehealth (HOSPITAL_COMMUNITY): Payer: Self-pay

## 2016-03-23 NOTE — Telephone Encounter (Signed)
This patient is a transfer from Dr. Rutherford Limerickadepalli, patients mother is calling, they have an appointment with you later this month, but he needs a refill on Vyvanse now. Please review and advise, thank you

## 2016-03-24 ENCOUNTER — Other Ambulatory Visit (HOSPITAL_COMMUNITY): Payer: Self-pay | Admitting: Psychiatry

## 2016-03-24 ENCOUNTER — Telehealth (HOSPITAL_COMMUNITY): Payer: Self-pay

## 2016-03-24 DIAGNOSIS — F900 Attention-deficit hyperactivity disorder, predominantly inattentive type: Secondary | ICD-10-CM

## 2016-03-24 MED ORDER — LISDEXAMFETAMINE DIMESYLATE 30 MG PO CAPS: 30.0000 mg | ORAL_CAPSULE | Freq: Every day | ORAL | Status: DC

## 2016-03-24 NOTE — Telephone Encounter (Signed)
Dr. Pincus SanesAgawal printed and signed the prescription, I called patients mother to let her know it is ready for pick up

## 2016-03-24 NOTE — Telephone Encounter (Signed)
03/24/16 3:39PM Pt's mother [name] RU#[id number] came and pick-up rx script.Marland Kitchen.Marguerite Olea/sh

## 2016-04-16 ENCOUNTER — Ambulatory Visit (INDEPENDENT_AMBULATORY_CARE_PROVIDER_SITE_OTHER): Payer: BLUE CROSS/BLUE SHIELD | Admitting: Psychiatry

## 2016-04-16 ENCOUNTER — Encounter (HOSPITAL_COMMUNITY): Payer: Self-pay | Admitting: Psychiatry

## 2016-04-16 VITALS — BP 118/64 | HR 95 | Ht 70.0 in | Wt 147.6 lb

## 2016-04-16 DIAGNOSIS — F900 Attention-deficit hyperactivity disorder, predominantly inattentive type: Secondary | ICD-10-CM

## 2016-04-16 MED ORDER — LISDEXAMFETAMINE DIMESYLATE 30 MG PO CAPS: 30.0000 mg | ORAL_CAPSULE | ORAL | Status: DC

## 2016-04-16 MED ORDER — LISDEXAMFETAMINE DIMESYLATE 30 MG PO CAPS: 30.0000 mg | ORAL_CAPSULE | Freq: Every day | ORAL | Status: DC

## 2016-04-16 NOTE — Progress Notes (Signed)
Patient ID: Mark Nicholson, male   DOB: 1996-05-19, 20 y.o.   MRN: [id number]  [facility]  Progress Note  Mark Nicholson [id number] 19 y.o.  04/16/2016 4:15 PM  Diagnosis.    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   Pt is working at [employer] and is planning on taking online classes next semester.   Pt is taking Vyvanse every morning after waking up. The effect does not seem to wear off. Denies side effects of insomnia, decreased appetite, HA, GI upset or mood changes.   Sleep, appetite and energy are good.  Pt denies depression. Denies anhedonia, isolation, crying spells, low motivation, poor hygiene, worthlessness and hopelessness. Denies SI/HI.  Denies anxiety.   Taking meds as prescribed and denies SE.    Review of Systems  Constitutional: Negative for fever, chills, weight loss and malaise/fatigue.  HENT: Negative for ear pain, nosebleeds and sore throat.   Eyes: Negative for blurred vision, double vision and redness.  Respiratory: Negative for cough, sputum production, shortness of breath and wheezing.   Cardiovascular: Negative for chest pain, palpitations, leg swelling and PND.  Gastrointestinal: Negative for heartburn, nausea, vomiting and abdominal pain.  Musculoskeletal: Negative for myalgias, back pain, joint pain, falls and neck pain.  Skin: Negative for itching and rash.  Neurological: Negative for dizziness, tremors, sensory change, seizures, loss of consciousness and headaches.  Psychiatric/Behavioral: Negative for depression, suicidal ideas, hallucinations, memory loss and substance abuse. The patient is not nervous/anxious and does not have insomnia.        History  from the initial visit with Dr. Rutherford Limerickadepalli  Pt is 20 year old AAM, with h/o ADHD. Pt has h/o seizures, and is on lamotrigine LA 250 mg hs. In addition, he has h/o vitiligo with associated anxiety and problems at [employer] now resolved Pt is on vyvanse 30 mg po and is doing well on it. Sleeping and eating are better. He denies any depression, anxiety, mania, or psychosis. He denies SI/HI/AVH. Calm, cooperative, during interview.concentration is good. Noted to have white patches on his face, and is on a topical cream.Doingwell at school and has changed Major to Gaming programming. He has 1 course on Campus and the reamainegr are online at home .Marland Kitchen.Mom came with him but did not attend visit. He does not any longer feel the need for counseling..  Suicidal Ideation: Negative Plan Formed: NA Patient has means to carry out plan: NA  Homicidal Ideation: Negative Plan Formed: NA Patient has means to carry out plan: NA     Past Medical Family, Social History:   Past Medical History    Diagnosis   Date    .   Physical growth delay       .   Goiter       .   Poor appetite       .   ADHD (attention deficit hyperactivity disorder)       .   Puberty delay       .   Thyroiditis, autoimmune       .   Seizures         ADDITIONAL  PAST HISTORY 1. 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  2. Gynecomastia: His breast buds have disappeared. His areolae are flat. His gynecomastia has essentially resolved. We do not need to follow this problem any longer.      3. Pubertal delay: His puberty has progressed nicely.    4. Decreased appetite/weight loss: These problems have resolved. 5. Vitiligo: The vitiligo seems to be improving.   6. Goiter: His thyroid gland is larger and softer today. These findings imply less inflammation by Hashimoto's disease WBCs, but could also occur if the TSIs are higher. He was euthyroid in April. He will probably become  hypothyroid in the next 5 years.  His TFTs have been mid-range normal since December 2013. He is clinically euthyroid today.   7. Hashimoto's Disease: The presence of positive antibodies in the setting of a goiter that waxes and wanes and his positive family history all suggest that [name] will eventually develop hypothyroidism. At present, however, the thyroiditis is clinically quiescent. .He started freshman year in college, at [employer] in [place] of 2015 to study Dance movement psychotherapist.His 1st semester did not go well. He flunked multiple subjects Calculus,Philosophy,Chemistry and is on academic probation.Mom wants him to get counseling but he is reluctant.His problems dont stem from attention deficit,rather from his inability to understand and his reluctance to ask for help/reveal his weakness.He appears to have had a reluctance to seek on campus help due to his epilepsy and vitiligo which already make him "different" in the eyes of his peers.He did see counselor [name] 1 time at [facility] but didnt feel need to continue with counseling.   Hospitalizations: No., Head Injury: No., Nervous System Infections: No., Immunizations up to date: Yes.    Allergies:NO KNOWN ALLERGIES   Substance Abuse History in the last 12 months: none  Social History: Current Place of Residence: [place] Place of Birth:  [birth year] Family Members: parents, and sister, age 46   Children: na             Sons: na             Daughters: na Relationships: none    Developmental History: Physical and puberty delay Prenatal History:   Birth History:   Postnatal Infancy:   Developmental History:   Milestones:  Sit-Up:    Crawl:   Walk:   Speech: School History:    Freshman year of college, and [employer] Legal History: The patient has no significant history of legal issues. Hobbies/Interests: computers   Family History:    Family History   Problem  Relation  Age of Onset   .  Diabetes  Paternal Aunt      .  Thyroid disease  Paternal Aunt     .  Cancer  Neg Hx     .  Seizures  Father  37       6-13 yo unresponsive staring, generalized clonic   .  Lung cancer  Maternal Grandfather     .  Seizures  Maternal Grandfather         Related to alcohol use and sleep deprivation. treated w phenobarbital   .  Alcohol abuse  Maternal Grandfather     .  Seizures  Paternal Grandfather         Outpatient Encounter Prescriptions as of 04/16/2016  Medication Sig  . cyproheptadine (PERIACTIN) 4 MG tablet Take 1 tablet (4 mg total) by mouth 2 (two) times daily.  . LamoTRIgine XR 200 MG TB24 Take 1 tablet by mouth twice per day  .  lisdexamfetamine (VYVANSE) 30 MG capsule Take 1 capsule (30 mg total) by mouth daily.  . pimecrolimus (ELIDEL) 1 % cream Apply 1 application topically 2 (two) times daily as needed. Reported on 11/27/2015   No facility-administered encounter medications on file as of 04/16/2016.    Past Psychiatric History/Hospitalization(s)      Past Psychiatric History: Diagnosis: ADHD   Hospitalizations:  no   Outpatient Care:  yes   Substance Abuse Care:  no   Self-Mutilation:  no   Suicidal Attempts:  no   Violent Behaviors:  no    Anxiety: Negative Bipolar Disorder: Negative Depression: Negative Mania: Negative Psychosis: Negative Schizophrenia: Negative Personality Disorder: Negative Hospitalization for psychiatric illness: Negative History of Electroconvulsive Shock Therapy: Negative Prior Suicide Attempts: Negative  Physical Exam: Constitutional:  BP 118/64 mmHg  Pulse 95  Ht  (1.778 m)  Wt 147 lb 9.6 oz (66.951 kg)  BMI 21.18 kg/m2  General Appearance: alert, oriented, no acute distress and well nourished  Musculoskeletal: Strength & Muscle Tone: within normal limits Gait & Station: normal Patient leans: N/A  Mental Status Examination/Evaluation: Objective: Attitude: Calm and cooperative  Appearance: Fairly Groomed, appears to be stated age  Eye  Contact::  Good  Speech:  Clear and Coherent and Normal Rate  Volume:  Normal  Mood:  euthymic  Affect:  Appropriate and Full Range  Thought Process:  Coherent and Goal Directed  Orientation:  Full (Time, Place, and Person)  Thought Content:  Logical  Suicidal Thoughts:  No  Homicidal Thoughts:  No  Judgement:  Good  Insight:  Good  Concentration: good  Memory: Immediate-good Recent-good Remote-good  Recall: fair  Language: fair  Gait and Station: normal  [employer] of Knowledge: average  Psychomotor Activity:  Normal  Akathisia:  No  Handed:  Right  AIMS (if indicated):  n/a   Assets:  Manufacturing systems engineer Desire for Improvement Financial Resources/Insurance Housing Leisure Time Physical Health Resilience Social Support Veterinary surgeon Decision Making (Choose Three): Established Problem, Stable/Improving (1), Review of Last Therapy Session (1) and Review of Medication Regimen & Side Effects (2)  ASSESSMENT: ADHD predominately inattentive type  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   Physical growth delay       .   Goiter       .   Poor appetite       .   ADHD (attention deficit hyperactivity disorder)       .   Puberty delay       .   Thyroiditis, autoimmune       .   Seizures            Medical Decision Making (Choose Three): Established Problem, Stable/Improving (1), Review of Last Therapy Session (1) and Review of Medication Regimen & Side Effects (2)  Plan: ADHD inattentive type Continue Vyvanse 30 mg by mouth every morning  Seizure disorder Is being treated with Lamictal by Dr. [name]  Labs none during this visit. Therapy patient refuses to see a therapist  Therapy: brief supportive therapy provided. Discussed psychosocial stressors in detail.    Pt denies SI and is at an acute low risk for suicide.Patient told to call  clinic if any problems occur. Patient advised to go to ER if they should develop SI/HI, side effects, or if symptoms worsen. Has  crisis numbers to call if needed. Pt verbalized understanding.  F/up in 3 months or sooner if needed         Oletta DarterSalina Joriel Streety, MD 04/16/2016

## 2016-05-04 ENCOUNTER — Encounter: Payer: Self-pay | Admitting: Pediatrics

## 2016-05-04 ENCOUNTER — Ambulatory Visit (INDEPENDENT_AMBULATORY_CARE_PROVIDER_SITE_OTHER): Payer: BLUE CROSS/BLUE SHIELD | Admitting: Pediatrics

## 2016-05-04 VITALS — BP 104/62 | HR 76 | Ht 69.5 in | Wt 142.4 lb

## 2016-05-04 DIAGNOSIS — G40309 Generalized idiopathic epilepsy and epileptic syndromes, not intractable, without status epilepticus: Secondary | ICD-10-CM

## 2016-05-04 DIAGNOSIS — G40109 Localization-related (focal) (partial) symptomatic epilepsy and epileptic syndromes with simple partial seizures, not intractable, without status epilepticus: Secondary | ICD-10-CM

## 2016-05-04 MED ORDER — LAMOTRIGINE ER 200 MG PO TB24: ORAL_TABLET | ORAL | Status: DC

## 2016-05-04 NOTE — Progress Notes (Signed)
Patient: Mark Nicholson MRN: [id number] Sex: male DOB: 02-18-1996  Provider: Deetta PerlaHICKLING,Sokha Craker H, MD [facility]  Note type: Routine return visit  History of Present Illness: Referral Source: Dr. Kriste BasqueHannah Kim History from: [facility] and [facility] Chief Complaint: Generalized convulsive epilepsy  Mark Nicholson is a 20 y.o. male who returns April 24, 2016, for the first time since October 08, 2015.  He has localization related and generalized convulsive epilepsy that is well controlled.  He takes Lamictal XR 200 mg twice daily without side effects.  His past history is recorded below.  He works at [employer] in the [employer].  He is now working 40 hours a week.  He lives with his parents.  He works and receives Restaurant manager, fast foodonline instruction from [employer] in Special educational needs teachervideo game simulation design.  His thyroiditis is stable.  [facility].  His general health has been good.  He is sleeping well.  He is driving his own vehicle.  He needs a prescription refill today.  Review of Systems: 12 system review was assessed and was negative  Past Medical History Diagnosis Date  . Physical growth delay     sees endo  . Goiter     sees endo  . Poor appetite   . Puberty delay   . Thyroiditis, autoimmune     sees endo  . ADHD (attention deficit hyperactivity disorder)     dx elementary school, sees psychiatrist  . Seizures (HCC)     1st in 9th grade, sees neurologist   Hospitalizations: No., Head Injury: No., Nervous System Infections: No., Immunizations up to date: Yes.    Nocturnal enuresis until 20 years of age.  Syncope in the fifth grade which caused him to be dazed, motor tic disorder-class in, short stature, delayed puberty, autoimmune thyroiditis with goiter, attention deficit hyperactivity disorder mixed type, osteochondritis dissecans, Lichen striatus. He is followed by Dr. Molli KnockMichael Brennan. The patient also vitiligo  on his face.  He had a single generalized tonic-clonic seizure on September 22, 2010.  EEG performed on October 02, 2010 showed brief generalized electrographic seizures of four seconds in duration without clinical accompaniments.  EEG on September 27, 2012 was normal.  [facility] to [facility] seizure on 05/25/14.  He presented on September 07, 2014, following a generalized seizure in the campus dorm room. He was late to take his lamotrigine. This was a brief generalized tonic-clonic seizure without warning and with postictal confusion. He had a lamotrigine level September 26, 2014, that was 12.9 mcg/mL.  Birth History 8 lbs. 1 oz. infant born at 7140 weeks gestational age tube a 20 year old primigravida.  Gestation complicated by a 27 pound weight gain.  Labor lasted for only one hour. Normal spontaneous vaginal delivery. Nursery course was uneventful. Breast feeding over 11 months.  Growth and development was recalled as normal.   Behavior History ADHD, depression  Surgical History Procedure Laterality Date  . Circumcision     Family History family history includes Alcohol abuse in his maternal grandfather; Diabetes in his paternal aunt; Lung cancer in his maternal grandfather; Seizures in his maternal grandfather and paternal grandfather; Seizures (age of onset: 106) in his father; Thyroid disease in his paternal aunt. There is no history of Cancer. Family history is negative for migraines, intellectual disabilities, blindness, deafness, birth defects, chromosomal disorder, or autism.  Social History . Marital Status: Single    Spouse Name:  N/A  . Number of Children: N/A  . Years of Education: N/A   Social History Main Topics  . Smoking status: Never Smoker   . Smokeless tobacco: Never Used  . Alcohol Use: No  . Drug Use: No  . 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 He is Glass blower/designer in Merchandiser, retail. He  enjoys playing video games and walking.     Lives with this parent and younger sister.    No Known Allergies  Physical Exam BP 104/62 mmHg  Pulse 76  Ht 5' 9.5" (1.765 m)  Wt 142 lb 6.4 oz (64.592 kg)  BMI 20.73 kg/m2  General: alert, well developed, well nourished, in no acute distress, black hair, brown eyes, left handed Head: normocephalic, no dysmorphic features Ears, Nose and Throat: Otoscopic: tympanic membranes normal; pharynx: oropharynx is pink without exudates or tonsillar hypertrophy Neck: supple, full range of motion, no cranial or cervical bruits Respiratory: auscultation clear Cardiovascular: no murmurs, pulses are normal Musculoskeletal: no skeletal deformities or apparent scoliosis Skin: no rashes or neurocutaneous lesions  Neurologic Exam  Mental Status: alert; oriented to person, place and year; knowledge is normal for age; language is normal Cranial Nerves: visual fields are full to double simultaneous stimuli; extraocular movements are full and conjugate; pupils are round reactive to light; funduscopic examination shows sharp disc margins with normal vessels; symmetric facial strength; midline tongue and uvula; air conduction is greater than bone conduction bilaterally Motor: Normal strength, tone and mass; good fine motor movements; no pronator drift Sensory: intact responses to cold, vibration, proprioception and stereognosis Coordination: good finger-to-nose, rapid repetitive alternating movements and finger apposition Gait and Station: normal gait and station: patient is able to walk on heels, toes and tandem without difficulty; balance is adequate; Romberg exam is negative; Gower response is negative Reflexes: symmetric and diminished bilaterally; no clonus; bilateral flexor plantar responses  Assessment 1. Generalized convulsive epilepsy, G40.309. 2. 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  Discussion  I am pleased that  Gildo seizures are under good control.  I do not think that there is going to be a convenient time for him to come off medication because he does not want to stop driving.    Plan I spent 15 minutes of face-to-face time with Mark Nicholson.  I refilled his prescription for lamotrigineXR.  He will return to see me in six months' time, sooner depending upon clinical need.   Medication List   This list is accurate as of: 05/04/16 11:59 PM.       cyproheptadine 4 MG tablet  Commonly known as:  PERIACTIN  Take 1 tablet (4 mg total) by mouth 2 (two) times daily.     LamoTRIgine XR 200 MG Tb24  Take 1 tablet by mouth twice per day     lisdexamfetamine 30 MG capsule  Commonly known as:  VYVANSE  Take 1 capsule (30 mg total) by mouth daily.     pimecrolimus 1 % cream  Commonly known as:  ELIDEL  Apply 1 application topically 2 (two) times daily as needed. Reported on 11/27/2015      The medication list was reviewed and reconciled. All changes or newly prescribed medications were explained.  A complete medication list was provided to the patient/caregiver.  Deetta Perla MD

## 2016-05-27 ENCOUNTER — Encounter: Payer: Self-pay | Admitting: Family Medicine

## 2016-05-27 ENCOUNTER — Ambulatory Visit (INDEPENDENT_AMBULATORY_CARE_PROVIDER_SITE_OTHER)
Admission: RE | Admit: 2016-05-27 | Discharge: 2016-05-27 | Disposition: A | Discharge status: Home / Self Care | Payer: BLUE CROSS/BLUE SHIELD | Source: Ambulatory Visit | Attending: Family Medicine | Admitting: Family Medicine

## 2016-05-27 ENCOUNTER — Ambulatory Visit (INDEPENDENT_AMBULATORY_CARE_PROVIDER_SITE_OTHER): Payer: BLUE CROSS/BLUE SHIELD | Admitting: Family Medicine

## 2016-05-27 VITALS — BP 110/60 | HR 102 | Temp 98.3°F | Ht 69.5 in | Wt 147.3 lb

## 2016-05-27 DIAGNOSIS — R0781 Pleurodynia: Secondary | ICD-10-CM

## 2016-05-27 NOTE — Patient Instructions (Signed)
Follow up for any fever or increased shortness of breath Continue some Ibuprofen as needed.

## 2016-05-27 NOTE — Progress Notes (Signed)
Subjective:     Patient ID: Mark BladeJeffrey D Tlatelpa, male   DOB: [birth year], 20 y.o.   MRN: [id number]  HPI Acute visit for 2 day history of right sided chest pains. Somewhat of a pleuritic quality. No dyspnea. No cough. No fevers or chills. Pain seems to be somewhat better sitting up. Sharp quality and moderate severity. Took Ibuprofen with mild relief. Denies any recent lower extremity edema or calf pain. No recent travels. Nonsmoker.  No recent injury. No overuse activities. Pain seems to radiate from the right chest wall region toward the back. No clear exacerbating factors. No abdominal pain. No nausea or vomiting. Pt is a non-smoker.  Past Medical History:  Diagnosis Date  . ADHD (attention deficit hyperactivity disorder)    dx elementary school, sees psychiatrist  . Goiter    sees endo  . Physical growth delay    sees endo  . Poor appetite   . Puberty delay   . Seizures (HCC)    1st in 9th grade, sees neurologist  . Thyroiditis, autoimmune    sees endo   Past Surgical History:  Procedure Laterality Date  . CIRCUMCISION      reports that he has never smoked. He has never used smokeless tobacco. He reports that he does not drink alcohol or use drugs. family history includes Alcohol abuse in his maternal grandfather; Diabetes in his paternal aunt; Lung cancer in his maternal grandfather; Seizures in his maternal grandfather and paternal grandfather; Seizures (age of onset: 946) in his father; Thyroid disease in his paternal aunt. No Known Allergies   Review of Systems  Constitutional: Negative for appetite change, chills, fever and unexpected weight change.  Respiratory: Negative for cough and shortness of breath.   Cardiovascular: Positive for chest pain. Negative for palpitations and leg swelling.  Gastrointestinal: Negative for abdominal pain, nausea and vomiting.  Skin: Negative for rash.       Objective:   Physical Exam  Constitutional: He appears well-developed and  well-nourished.  Neck: Neck supple.  Cardiovascular: Normal rate, regular rhythm and normal heart sounds.  Exam reveals no gallop and no friction rub.   No murmur heard. Pulmonary/Chest: Effort normal and breath sounds normal. No respiratory distress. He has no wheezes. He has no rales.  Chest wall non-tender to palpation.  Abdominal: Soft. He exhibits no mass. There is no tenderness. There is no rebound and no guarding.  Musculoskeletal: He exhibits no edema.  Lymphadenopathy:    He has no cervical adenopathy.       Assessment:     Right-sided chest pain. Patient is in no distress. No hypoxemia. Normal heart and lung exam. Differential is musculoskeletal, spontaneous pneumothorax, less likely PE.  Doubt pericarditis with mostly right-sided symptoms.    Plan:     -Check d-dimer -Chest x-ray -Continue ibuprofen in the meantime -Follow-up immediately for any shortness of breath, fever or other new symptoms  Kristian CoveyBruce W Kennie Karapetian MD [facility]

## 2016-05-27 NOTE — Progress Notes (Signed)
Pre visit review using our clinic review tool, if applicable. No additional management support is needed unless otherwise documented below in the visit note. 

## 2016-05-28 ENCOUNTER — Telehealth: Payer: Self-pay | Admitting: Family Medicine

## 2016-05-28 LAB — D-DIMER, QUANTITATIVE: D-Dimer, Quant: 0.48 mcg/mL FEU (ref <0.50)

## 2016-05-28 NOTE — Telephone Encounter (Signed)
Pt need note for work.

## 2016-05-28 NOTE — Telephone Encounter (Signed)
OK 

## 2016-05-28 NOTE — Telephone Encounter (Signed)
Okay? Pt was seen yesterday.

## 2016-05-29 NOTE — Telephone Encounter (Signed)
Mother is aware that note is up front for pick up.

## 2016-06-01 ENCOUNTER — Encounter: Payer: Self-pay | Admitting: *Deleted

## 2016-06-01 ENCOUNTER — Encounter: Payer: Self-pay | Admitting: "Endocrinology

## 2016-06-01 ENCOUNTER — Ambulatory Visit (INDEPENDENT_AMBULATORY_CARE_PROVIDER_SITE_OTHER): Payer: BLUE CROSS/BLUE SHIELD | Admitting: "Endocrinology

## 2016-06-01 VITALS — BP 120/76 | HR 76 | Wt 142.6 lb

## 2016-06-01 DIAGNOSIS — E049 Nontoxic goiter, unspecified: Secondary | ICD-10-CM

## 2016-06-01 DIAGNOSIS — R634 Abnormal weight loss: Secondary | ICD-10-CM

## 2016-06-01 DIAGNOSIS — E063 Autoimmune thyroiditis: Secondary | ICD-10-CM

## 2016-06-01 DIAGNOSIS — L80 Vitiligo: Secondary | ICD-10-CM | POA: Diagnosis not present

## 2016-06-01 DIAGNOSIS — E300 Delayed puberty: Secondary | ICD-10-CM

## 2016-06-01 NOTE — Progress Notes (Signed)
Subjective:  Patient Name: Mark Nicholson Date of Birth: 1995/12/05  MRN: [id number]  Mark Nicholson  presents to the office today for follow-up evaluation and management of his delayed puberty, poor weight gain, growth delay, gynecomastia, vitiligo, seizures, ADD, and short stature.  HISTORY OF PRESENT ILLNESS:   Mark Nicholson is a 20 y.o. African-American young man.  Mark Nicholson was unaccompanied.  1. Mark Nicholson was first seen in our clinic on 01/28/09 after referral by his primary care provider, Dr. Randell Loop of [facility], for evaluation and management of growth delay in association with taking medication for ADHD. The patient was then 13-1/2.  He reportedly had previously been at about the 50% on both the height growth curve and weight growth curve. His appetite decreased markedly on ADD medications. His growth fell off as well. The patient was only hungry at the very end of the day. He was started on cyproheptadine and liberalization of his diet, and responded well. He did try stopping the cyproheptadine but his weight fell.  He also had a delayed start to puberty secondary to poor weight gain and poor growth. Since neither he nor his mother wanted him to take cyproheptadine, I agreed in May 2014 to stop the cyproheptadine as long as he continued to eat and to grow. His appetite did increase later and his growth in weight and height increased accordingly. His puberty also progressed.  2. Mark Nicholson's last [facility] visit was on 11/27/15.   A. In the interim, he has been pretty healthy. His appetite is pretty good. He feels that his vitiligo has improved significantly. His arms and legs have healed up. His face is healing. He has not had any further seizures since his last visit. He remains on Lamictal and  Vyvanse.  B. He has been followed for pubertal delay. Pubic hair, axillary hair, and genitalia are all continuing to increase in amount/size. Voice is deeper.   3. Pertinent Review of  Systems:  Constitutional: The patient feels "good". He says he has been seems healthy and active. Eyes: Vision seems to be good with his current eye glasses or contacts. There are no recognized eye problems. Neck: The patient has no complaints of anterior neck swelling, soreness, tenderness, pressure, discomfort, or difficulty swallowing.  Heart: Heart rate increases with exercise or other physical activity. The patient has no complaints of palpitations, irregular heart beats, chest pain, or chest pressure.   Gastrointestinal: Bowel movents seem normal. The patient has no complaints of excessive hunger, acid reflux, upset stomach, stomach aches or pains, diarrhea, or constipation.  Legs: Muscle mass and strength seem normal. There are no complaints of numbness, tingling, burning, or pain. No edema is noted.  Feet: There are no obvious foot problems. There are no complaints of numbness, tingling, burning, or pain. No edema is noted. Neurologic: There are no recognized problems with muscle movement and strength, sensation, or coordination. Chest:  Breast are flat now.  Skin: He continues his treatment with UV light therapy at [facility]. He has the UV therapy 2 times per week.    PAST MEDICAL, FAMILY, AND SOCIAL HISTORY  Past Medical History:  Diagnosis Date  . ADHD (attention deficit hyperactivity disorder)    dx elementary school, sees psychiatrist  . Goiter    sees endo  . Physical growth delay    sees endo  . Poor appetite   . Puberty delay   . Seizures (HCC)    1st in 9th grade, sees neurologist  . Thyroiditis,  autoimmune    sees endo    Family History  Problem Relation Age of Onset  . Diabetes Paternal Aunt   . Thyroid disease Paternal Aunt   . Cancer Neg Hx   . Seizures Father 476    6-13 yo unresponsive staring, generalized clonic  . Lung cancer Maternal Grandfather   . Seizures Maternal Grandfather     Related to alcohol use and sleep deprivation. treated w  phenobarbital  . Alcohol abuse Maternal Grandfather   . Seizures Paternal Grandfather      Current Outpatient Prescriptions:  .  LamoTRIgine XR 200 MG TB24, Take 1 tablet by mouth twice per day, Disp: 62 tablet, Rfl: 5 .  lisdexamfetamine (VYVANSE) 30 MG capsule, Take 1 capsule (30 mg total) by mouth daily., Disp: 30 capsule, Rfl: 0 .  pimecrolimus (ELIDEL) 1 % cream, Apply 1 application topically 2 (two) times daily as needed. Reported on 11/27/2015, Disp: , Rfl:   Allergies as of 06/01/2016  . (No Known Allergies)     reports that he has never smoked. He has never used smokeless tobacco. He reports that he does not drink alcohol or use drugs. Pediatric History  Patient Guardian Status  . Mother:  Christean LeafMeadows,[name]  . Father:  Guertin,Kennieth   Other Topics Concern  . 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 He enjoys playing video games and walking.    Lives with this parent and younger sister.         1. School and family: He is a Medical laboratory scientific officersophomore at [employer] and is taking on-line classes. He wants to study simulation gaming.  2. Activities: He is not doing much for physical activity. He works at [employer] doing a lot of carrying and lifting.  3. Primary Care Provider: Terressa KoyanagiKIM, HANNAH R., DO, [facility], [place] [facility]. Neurology: Dr. Sharene SkeansHickling  REVIEW OF SYSTEMS: There are no other significant problems involving Mark Nicholson's other body systems.   Objective:  Vital Signs:  BP 120/76   Pulse 76   Wt 142 lb 9.6 oz (64.7 kg)   BMI 20.76 kg/m    Ht Readings from Last 3 Encounters:  05/27/16 5' 9.5" (1.765 m)  05/04/16 5' 9.5" (1.765 m)  10/08/15 5\' 9"  (1.753 m) (41 %, Z= -0.22)*   * Growth percentiles are based on CDC 2-20 Years data.   Wt Readings from Last 3 Encounters:  06/01/16 142 lb 9.6 oz (64.7 kg)  05/27/16 147 lb 4.8 oz (66.8 kg)  05/04/16 142 lb 6.4 oz (64.6 kg)   HC Readings  from Last 3 Encounters:  [facility]   Body surface area is 1.78 meters squared. Facility age limit for growth percentiles is 20 years. Facility age limit for growth percentiles is 20 years.    PHYSICAL EXAM:  Constitutional: The patient appears healthy, but slender.  His height growth has ceased. He has gained 8 pounds in the past 6 months. He is bright and alert, but looks a bit tired today. He had to get up on his day off.  Head: The head is normocephalic. Face: His vitiligo is better. He has more pink areas and brown areas and no pure white areas. The skin pigment is slowly returning to normal.  Eyes: The eyes appear to be normally formed and spaced. Gaze is conjugate. There is no obvious arcus or proptosis. Moisture appears normal. Ears: The  ears are normally placed and appear externally normal. Mouth: The oropharynx and tongue appear normal. Dentition appears to be normal for age. Oral moisture is normal. Neck: The neck appears to be visibly normal. No carotid bruits are noted. The strap muscles are appropriately enlarged. The thyroid gland is larger at 23+ grams in size. The right lobe is only mildly enlarged, but the left lobe is larger. The consistency of the right thyroid lobe is normal, but the left lobe is still "relatively full". The thyroid gland is not tender to palpation. He has 1+ acanthosis. Lungs: The lungs are clear to auscultation. Air movement is good. Heart: Heart rate and rhythm are regular. Heart sounds S1 and S2 are normal. I did not appreciate any pathologic cardiac murmurs. Abdomen: The abdomen is normal in size for the patient's age. Bowel sounds are normal. There is no obvious hepatomegaly, splenomegaly, or other mass effect.  Arms: Muscle size and bulk are normal for age. Hands: There is no obvious tremor. Phalangeal and metacarpophalangeal joints are normal. Palmar muscles are normal for age. Palmar skin is normal. Palmar moisture is also normal. Legs:  Muscles appear normal for age. No edema is present. Neurologic: Strength is normal for age in both the upper and lower extremities. Muscle tone is normal. Sensation to touch is normal in both legs.   Breasts: Tanner stage I. Right areola measures 30 mm in longest dimension, right 29 mm.  LAB DATA:  11/27/15: Testosterone 330.  11/20/15: TSH 1.564, free T4 1.10, free T3 3.2; LH 5.7, FSH 14.3;    02/13/15: LH 8.5, FSH 15.2, testosterone 311  02/12/14: TSH 1.060, Free T4 1.13, free T3 3.4  07/19/13: TSH 0.756, free T4 1.19, free T3 3.6  02/13/13: TSH 1.333, free T4 0.98, free T3 3.2  10/03/12: TSH 1.285, free T4 1.34, free T3 3.5   12/28/11: TSH 2.154, free T4 1.18, free T3 3.6  IMAGING: Bone age on 02/20/13: BA 14 at CA 17. I reviewed the study. The BA was actually 14.5 years.   Assessment and Plan:   ASSESSMENT:  1. Growth delay, physical: His height growth has ceased, c/w his age. He has gained 8 pounds since his last visit.   2. Pubertal delay: His puberty has progressed clinically.  His testosterone in February was low for age, but had increased since the year prior. We need to see his testosterone, FSH, and LH to assess his puberty from a hormonal viewpoint.   3. Decreased appetite/unintentional weight loss: His appetite is better and he has gained 8 pounds since his last visit.  4. Vitiligo: The vitiligo is better still at today's visit. There are no dead-white areas in his face or neck today.    5. Goiter: His thyroid gland is larger today. The waxing and waning of thyroid gland size is c/w evolving Hashimoto's thyroiditis. He was mid-euthyroid in February. 6. Hashimoto's Disease: The presence of positive antibodies in the setting of a goiter that waxes and wanes and his positive family history all suggest that Mark Nicholson will eventually develop hypothyroidism. At present, however, the thyroiditis is clinically quiescent.  PLAN:  1. Diagnostic: TFTs, testosterone, LH, FSH, and  testosterone today.  2. Therapeutic: Eat normally. Try to fit in more exercise. 3. Patient education: We discussed weight growth, his vitiligo, his pubertal course thus far, and his autoimmune diseases. I told him that we need to continue to follow his thyroid status and his pubertal status about every 6 months. He concurs. 4. Follow-up: 6  months   Level of Service: This visit lasted in excess of 50 minutes. More than 50% of the visit was devoted to counseling.  David StallBRENNAN,Azaan Leask J, MD

## 2016-06-01 NOTE — Patient Instructions (Signed)
Follow up viait in 6 months.

## 2016-06-02 LAB — TESTOSTERONE TOTAL,FREE,BIO, MALES
Albumin: 4.6 g/dL (ref 3.6–5.1)
SEX HORMONE BINDING: 25 nmol/L (ref 10–50)
TESTOSTERONE BIOAVAILABLE: 127.8 ng/dL — AB (ref 130.5–681.7)
Testosterone, Free: 60.8 pg/mL (ref 47.0–244.0)
Testosterone: 379 ng/dL (ref 250–827)

## 2016-06-02 LAB — TSH: TSH: 0.53 mIU/L (ref 0.40–4.50)

## 2016-06-02 LAB — LUTEINIZING HORMONE: LH: 6.1 m[IU]/mL (ref 1.5–9.3)

## 2016-06-02 LAB — T4, FREE: FREE T4: 1.2 ng/dL (ref 0.8–1.4)

## 2016-06-02 LAB — T3, FREE: T3, Free: 3.6 pg/mL (ref 3.0–4.7)

## 2016-06-02 LAB — FOLLICLE STIMULATING HORMONE: FSH: 18.2 m[IU]/mL — AB (ref 1.6–8.0)

## 2016-07-09 ENCOUNTER — Encounter (HOSPITAL_COMMUNITY): Payer: Self-pay | Admitting: Psychiatry

## 2016-07-09 ENCOUNTER — Ambulatory Visit (INDEPENDENT_AMBULATORY_CARE_PROVIDER_SITE_OTHER): Payer: BLUE CROSS/BLUE SHIELD | Admitting: Psychiatry

## 2016-07-09 DIAGNOSIS — F900 Attention-deficit hyperactivity disorder, predominantly inattentive type: Secondary | ICD-10-CM | POA: Diagnosis not present

## 2016-07-09 MED ORDER — LISDEXAMFETAMINE DIMESYLATE 30 MG PO CAPS: 30.0000 mg | ORAL_CAPSULE | ORAL | 0 refills | Status: DC

## 2016-07-09 MED ORDER — LISDEXAMFETAMINE DIMESYLATE 30 MG PO CAPS: 30.0000 mg | ORAL_CAPSULE | Freq: Every day | ORAL | 0 refills | Status: DC

## 2016-07-09 NOTE — Progress Notes (Signed)
Patient ID: Mark Nicholson, male   DOB: 1996/03/29, 20 y.o.   MRN: [id number]  [facility]  Progress Note  Mark Nicholson [id number] 19 y.o.  07/09/2016 3:49 PM  Diagnosis.    ADHD predominately inattentive type-Followup/Med Management   Adjustment disorder with problems at school resolvecd   Epilepsy-no seizures since Dec 15,2015 on medication followed by Dr Mark Nicholson    Subjective "I'm doing well"  History of Present Illness:--- Patient seen today for medication follow-up. He has no concerns today.   Pt is working at [employer] and is taking online classes next semester. Classes are going well.   Pt is taking Vyvanse every morning after waking up. The effect does not seem to wear off.  Denies side effects of insomnia, decreased appetite, HA, GI upset or mood changes.   Sleep, appetite and energy are good.  Pt denies depression. Denies anhedonia, isolation, crying spells, low motivation, poor hygiene, worthlessness and hopelessness. Denies SI/HI.  Denies anxiety.   Taking meds as prescribed and denies SE.    Review of Systems  Constitutional: Negative for chills, fever, malaise/fatigue and weight loss.  HENT: Negative for ear pain, nosebleeds and sore throat.   Eyes: Negative for blurred vision, double vision and redness.  Respiratory: Negative for cough, sputum production, shortness of breath and wheezing.   Cardiovascular: Negative for chest pain, palpitations, leg swelling and PND.  Gastrointestinal: Negative for abdominal pain, heartburn, nausea and vomiting.  Musculoskeletal: Negative for back pain, falls, joint pain, myalgias and neck pain.  Skin: Negative for itching and rash.  Neurological: Negative for dizziness, tremors, sensory change, seizures, loss of consciousness and headaches.  Psychiatric/Behavioral: Negative for depression, hallucinations, memory loss, substance abuse and suicidal ideas. The patient is not nervous/anxious and does  not have insomnia.        History from the initial visit with Dr. Rutherford Nicholson  Pt is 20 year old AAM, with h/o ADHD. Pt has h/o seizures, and is on lamotrigine LA 250 mg hs. In addition, he has h/o vitiligo with associated anxiety and problems at [facility] now resolved Pt is on vyvanse 30 mg po and is doing well on it. Sleeping and eating are better. He denies any depression, anxiety, mania, or psychosis. He denies SI/HI/AVH. Calm, cooperative, during interview.concentration is good. Noted to have white patches on his face, and is on a topical cream.Doingwell at school and has changed Major to Gaming programming. He has 1 course on Campus and the reamainegr are online at home .Marland KitchenMom came with him but did not attend visit. He does not any longer feel the need for counseling..  Suicidal Ideation: Negative Plan Formed: NA Patient has means to carry out plan: NA  Homicidal Ideation: Negative Plan Formed: NA Patient has means to carry out plan: NA     Past Medical Family, Social History:   Past Medical History    Diagnosis   Date    .   Physical growth delay       .   Goiter       .   Poor appetite       .   ADHD (attention deficit hyperactivity disorder)       .   Puberty delay       .   Thyroiditis, autoimmune       .   Seizures         ADDITIONAL  PAST HISTORY 1. 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    2. Gynecomastia: His breast buds have disappeared. His areolae are flat. His gynecomastia has essentially resolved. We do not need to follow this problem any longer.      3. Pubertal delay: His puberty has progressed nicely.    4. Decreased appetite/weight loss: These problems have resolved. 5. Vitiligo: The vitiligo seems to be improving.   6. Goiter: His thyroid gland is larger and softer today. These findings imply less inflammation by Hashimoto's disease WBCs, but could also occur if the TSIs are higher. He was  euthyroid in April. He will probably become hypothyroid in the next 5 years.  His TFTs have been mid-range normal since December 2013. He is clinically euthyroid today.   7. Hashimoto's Disease: The presence of positive antibodies in the setting of a goiter that waxes and wanes and his positive family history all suggest that Mark Nicholson will eventually develop hypothyroidism. At present, however, the thyroiditis is clinically quiescent. .He started freshman year in college, at [employer] in [place] of 2015 to study Dance movement psychotherapist.His 1st semester did not go well. He flunked multiple subjects Calculus,Philosophy,Chemistry and is on academic probation.Mom wants him to get counseling but he is reluctant.His problems dont stem from attention deficit,rather from his inability to understand and his reluctance to ask for help/reveal his weakness.He appears to have had a reluctance to seek on campus help due to his epilepsy and vitiligo which already make him "different" in the eyes of his peers.He did see counselor Mark Nicholson 1 time at [facility] but didnt feel need to continue with counseling.   Hospitalizations: No., Head Injury: No., Nervous System Infections: No., Immunizations up to date: Yes.    Allergies:NO KNOWN ALLERGIES   Substance Abuse History in the last 12 months: none  Social History: Current Place of Residence: [place] Place of Birth:  Oct 21, 1995 Family Members: parents, and sister, age 61   Children: na             Sons: na             Daughters: na Relationships: none    Developmental History: Physical and puberty delay Prenatal History:   Birth History:   Postnatal Infancy:   Developmental History:   Milestones:  Sit-Up:    Crawl:   Walk:   Speech: School History:    Freshman year of college, and [employer] Legal History: The patient has no significant history of legal issues. Hobbies/Interests: computers   Family History:    Family History   Problem  Relation   Age of Onset   .  Diabetes  Paternal Aunt     .  Thyroid disease  Paternal Aunt     .  Cancer  Neg Hx     .  Seizures  Father  109       6-13 yo unresponsive staring, generalized clonic   .  Lung cancer  Maternal Grandfather     .  Seizures  Maternal Grandfather         Related to alcohol use and sleep deprivation. treated w phenobarbital   .  Alcohol abuse  Maternal Grandfather     .  Seizures  Paternal Grandfather         Outpatient Encounter Prescriptions as of 07/09/2016  Medication Sig  . LamoTRIgine XR 200 MG TB24 Take 1 tablet by mouth twice per day  . lisdexamfetamine (VYVANSE) 30 MG capsule Take 1 capsule (  30 mg total) by mouth daily.  . pimecrolimus (ELIDEL) 1 % cream Apply 1 application topically 2 (two) times daily as needed. Reported on 11/27/2015   No facility-administered encounter medications on file as of 07/09/2016.     Past Psychiatric History/Hospitalization(s)      Past Psychiatric History: Diagnosis: ADHD   Hospitalizations:  no   Outpatient Care:  yes   Substance Abuse Care:  no   Self-Mutilation:  no   Suicidal Attempts:  no   Violent Behaviors:  no    Anxiety: Negative Bipolar Disorder: Negative Depression: Negative Mania: Negative Psychosis: Negative Schizophrenia: Negative Personality Disorder: Negative Hospitalization for psychiatric illness: Negative History of Electroconvulsive Shock Therapy: Negative Prior Suicide Attempts: Negative  Physical Exam: Constitutional:  BP 112/66   Pulse 72   Ht 5\' 10"  (1.778 m)   Wt 140 lb 3.2 oz (63.6 kg)   BMI 20.12 kg/m   General Appearance: alert, oriented, no acute distress and well nourished  Musculoskeletal: Strength & Muscle Tone: within normal limits Gait & Station: normal Patient leans: N/A  Mental Status Examination/Evaluation: Objective: Attitude: Calm and cooperative  Appearance: Fairly Groomed, appears to be stated age  Eye Contact::  Good  Speech:  Clear and Coherent and Normal Rate   Volume:  Normal  Mood:  euthymic  Affect:  Appropriate and Full Range  Thought Process:  Coherent and Goal Directed  Orientation:  Full (Time, Place, and Person)  Thought Content:  Logical  Suicidal Thoughts:  No  Homicidal Thoughts:  No  Judgement:  Good  Insight:  Good  Concentration: good  Memory: Immediate-good Recent-good Remote-good  Recall: fair  Language: fair  Gait and Station: normal  [employer] of Knowledge: average  Psychomotor Activity:  Normal  Akathisia:  No  Handed:  Right  AIMS (if indicated):  n/a   Assets:  Communication Skills Desire for Improvement Financial Resources/Insurance Housing Leisure Time Physical Health Resilience Social Support Talents/Skills Transportation Vocational/Educational        ASSESSMENT: ADHD predominately inattentive type  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   Physical growth delay       .   Goiter       .   Poor appetite       .   ADHD (attention deficit hyperactivity disorder)       .   Puberty delay       .   Thyroiditis, autoimmune       .   Seizures            Medical Decision Making (Choose Three): Established Problem, Stable/Improving (1), Review of Last Therapy Session (1) and Review of Medication Regimen & Side Effects (2)  Plan: ADHD inattentive type Continue Vyvanse 30 mg by mouth every morning  Seizure disorder Is being treated with Lamictal by Dr. Sharene SkeansHickling  Labs none during this visit. Therapy patient refuses to see a therapist  Therapy: brief supportive therapy provided. Discussed psychosocial stressors in detail.    Pt denies SI and is at an acute low risk for suicide.Patient told to call clinic if any problems occur. Patient advised to go to ER if they should develop SI/HI, side effects, or if symptoms worsen. Has crisis numbers to call if needed. Pt verbalized understanding.  F/up in 3 months or sooner if needed         Oletta DarterSalina Tavia Stave,  MD 07/09/2016

## 2016-10-15 ENCOUNTER — Encounter (HOSPITAL_COMMUNITY): Payer: Self-pay | Admitting: Psychiatry

## 2016-10-15 ENCOUNTER — Telehealth (INDEPENDENT_AMBULATORY_CARE_PROVIDER_SITE_OTHER): Payer: Self-pay | Admitting: "Endocrinology

## 2016-10-15 ENCOUNTER — Ambulatory Visit (INDEPENDENT_AMBULATORY_CARE_PROVIDER_SITE_OTHER): Payer: BLUE CROSS/BLUE SHIELD | Admitting: Psychiatry

## 2016-10-15 VITALS — BP 102/64 | HR 83 | Ht 70.0 in | Wt 147.0 lb

## 2016-10-15 DIAGNOSIS — F900 Attention-deficit hyperactivity disorder, predominantly inattentive type: Secondary | ICD-10-CM

## 2016-10-15 DIAGNOSIS — Z811 Family history of alcohol abuse and dependence: Secondary | ICD-10-CM | POA: Diagnosis not present

## 2016-10-15 DIAGNOSIS — Z8349 Family history of other endocrine, nutritional and metabolic diseases: Secondary | ICD-10-CM | POA: Diagnosis not present

## 2016-10-15 DIAGNOSIS — Z833 Family history of diabetes mellitus: Secondary | ICD-10-CM | POA: Diagnosis not present

## 2016-10-15 DIAGNOSIS — Z801 Family history of malignant neoplasm of trachea, bronchus and lung: Secondary | ICD-10-CM

## 2016-10-15 NOTE — Telephone Encounter (Signed)
1. I called the patient to review his last lab results:  A. His TFTs were at the upper end of the normal range, probably c/w a flare up of thyroiditis.   B. His LH, FSH, and testosterone had all increased.  2. I asked that he repeat his lab tests about two weeks before his next visit on 12/04/15. He agreed.  Molli KnockMichael Pema Thomure, MD, CDE

## 2016-10-15 NOTE — Progress Notes (Signed)
Patient ID: Mark Nicholson, male   DOB: [birth year], 20 y.o.   MRN: [id number]  [facility]  Progress Note  Mark Nicholson [id number] 19 y.o.  10/15/2016 2:44 PM  Diagnosis.    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 He has no concerns today.   Pt is working at [employer] and is taking online classes.  Classes are going well.   Pt is taking Vyvanse every morning after waking up. The effect does not seem to wear off. He notes decreased appetite when he takes Vyvanse.  Denies side effects of insomnia, HA, GI upset or mood changes.   Pt wonders if he can stop Vyvanse. He does not take it on weekends or days off. He feels the same on those days and thinks he can do well without it.   Sleep, appetite (3 meals per day) and energy are good.  Pt denies depression. Denies anhedonia, isolation, crying spells, low motivation, poor hygiene, worthlessness and hopelessness. Denies SI/HI.  Denies anxiety.   Taking meds as prescribed.    Review of Systems  Gastrointestinal: Negative for abdominal pain, heartburn, nausea and vomiting.  Musculoskeletal: Negative for back pain, falls, joint pain, myalgias and neck pain.  Neurological: Negative for tremors, sensory change, seizures and loss of consciousness.  Psychiatric/Behavioral: Negative for depression, hallucinations, memory loss, substance abuse and suicidal ideas. The patient is not nervous/anxious and does not have insomnia.        History from the initial visit with Dr. Rutherford Nicholson  Pt is 20 year old AAM, with h/o ADHD. Pt has h/o seizures, and is on lamotrigine LA 250 mg  hs. In addition, he has h/o vitiligo with associated anxiety and problems at [facility] now resolved Pt is on vyvanse 30 mg po and is doing well on it. Sleeping and eating are better. He denies any depression, anxiety, mania, or psychosis. He denies SI/HI/AVH. Calm, cooperative, during interview.concentration is good. Noted to have white patches on his face, and is on a topical cream.Doingwell at school and has changed Major to Gaming programming. He has 1 course on Campus and the reamainegr are online at home .Marland KitchenMom came with him but did not attend visit. He does not any longer feel the need for counseling..  Suicidal Ideation: Negative Plan Formed: NA Patient has means to carry out plan: NA  Homicidal Ideation: Negative Plan Formed: NA Patient has means to carry out plan: NA     Past Medical Family, Social History: reviewed information below with patient on 10/15/16  and same as previous visits except as noted   Past Medical History    Diagnosis   Date    .   Physical growth delay       .   Goiter       .   Poor appetite       .   ADHD (attention deficit hyperactivity disorder)       .   Puberty delay       .   Thyroiditis, autoimmune       .   Seizures         ADDITIONAL  PAST HISTORY 1. 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    2. Gynecomastia: His breast buds have disappeared. His areolae are flat. His gynecomastia has essentially resolved. We do not need to follow this problem any longer.      3. Pubertal delay: His puberty has progressed nicely.    4. Decreased appetite/weight loss: These problems have resolved. 5. Vitiligo: The vitiligo seems to be improving.   6. Goiter: His thyroid gland is larger and softer today. These findings imply less inflammation by Hashimoto's disease WBCs, but could also occur if the TSIs are higher. He was euthyroid in April. He will probably become hypothyroid in the next 5 years.   His TFTs have been mid-range normal since December 2013. He is clinically euthyroid today.   7. Hashimoto's Disease: The presence of positive antibodies in the setting of a goiter that waxes and wanes and his positive family history all suggest that Mark Nicholson will eventually develop hypothyroidism. At present, however, the thyroiditis is clinically quiescent. .He started freshman year in college, at [employer] in [place] Fall of 2015 to study Dance movement psychotherapistcomputer engineering.His 1st semester did not go well. He flunked multiple subjects Calculus,Philosophy,Chemistry and is on academic probation.Mom wants him to get counseling but he is reluctant.His problems dont stem from attention deficit,rather from his inability to understand and his reluctance to ask for help/reveal his weakness.He appears to have had a reluctance to seek on campus help due to his epilepsy and vitiligo which already make him "different" in the eyes of his peers.He did [facility] but didnt feel need to continue with counseling.   Hospitalizations: No., Head Injury: No., Nervous System Infections: No., Immunizations up to date: Yes.    Allergies:NO KNOWN ALLERGIES   Substance Abuse History in the last 12 months: none  Social History: Current Place of Residence: [place] Place of Birth:  May 03, 1996 Family Members: parents, and sister, age 21   Children: na             Sons: na             Daughters: na Relationships: none    Developmental History: Physical and puberty delay Prenatal History:   Birth History:   Postnatal Infancy:   Developmental History:   Milestones:  Sit-Up:    Crawl:   Walk:   Speech: School History:    Freshman year of college, and [employer] Legal History: The patient has no significant history of legal issues. Hobbies/Interests: computers   Family History:    Family History   Problem  Relation  Age of Onset   .  Diabetes  Paternal Aunt     .  Thyroid disease  Paternal  Aunt     .  Cancer  Neg Hx     .  Seizures  Father  486       6-13 yo unresponsive staring, generalized clonic   .  Lung cancer  Maternal Grandfather     .  Seizures  Maternal Grandfather         Related to alcohol use and sleep deprivation. treated w phenobarbital   .  Alcohol abuse  Maternal Grandfather     .  Seizures  Paternal Grandfather         Outpatient Encounter Prescriptions as of 10/15/2016  Medication Sig  . LamoTRIgine XR 200 MG TB24 Take 1 tablet by mouth twice per day  . lisdexamfetamine (VYVANSE) 30  MG capsule Take 1 capsule (30 mg total) by mouth daily.  Marland Kitchen lisdexamfetamine (VYVANSE) 30 MG capsule Take 1 capsule (30 mg total) by mouth daily.  Marland Kitchen lisdexamfetamine (VYVANSE) 30 MG capsule Take 1 capsule (30 mg total) by mouth every morning.  . pimecrolimus (ELIDEL) 1 % cream Apply 1 application topically 2 (two) times daily as needed. Reported on 11/27/2015   No facility-administered encounter medications on file as of 10/15/2016.     Past Psychiatric History/Hospitalization(s)      Past Psychiatric History: Diagnosis: ADHD   Hospitalizations:  no   Outpatient Care:  yes   Substance Abuse Care:  no   Self-Mutilation:  no   Suicidal Attempts:  no   Violent Behaviors:  no    Anxiety: Negative Bipolar Disorder: Negative Depression: Negative Mania: Negative Psychosis: Negative Schizophrenia: Negative Personality Disorder: Negative Hospitalization for psychiatric illness: Negative History of Electroconvulsive Shock Therapy: Negative Prior Suicide Attempts: Negative  Physical Exam: Constitutional:  BP 102/64 (BP Location: Left Arm, Patient Position: Sitting, Cuff Size: Normal)   Pulse 83   Ht 5\' 10"  (1.778 m)   Wt 147 lb (66.7 kg)   BMI 21.09 kg/m   General Appearance: alert, oriented, no acute distress and well nourished  Musculoskeletal: Strength & Muscle Tone: within normal limits Gait & Station: normal Patient leans: N/A  Mental Status  Examination/Evaluation: reviewed MSE on 10/15/16  and same as previous visits except as noted  Objective: Attitude: Calm and cooperative  Appearance: Fairly Groomed, appears to be stated age  Eye Contact::  Good  Speech:  Clear and Coherent and Normal Rate  Volume:  Normal  Mood:  euthymic  Affect:  Appropriate and Full Range  Thought Process:  Coherent and Goal Directed  Orientation:  Full (Time, Place, and Person)  Thought Content:  Logical  Suicidal Thoughts:  No  Homicidal Thoughts:  No  Judgement:  Good  Insight:  Good  Concentration: good  Memory: Immediate-good Recent-good Remote-good  Recall: fair  Language: fair  Gait and Station: normal  [employer] of Knowledge: average  Psychomotor Activity:  Normal  Akathisia:  No  Handed:  Right  AIMS (if indicated):  n/a   Assets:  Communication Skills Desire for Improvement Financial Resources/Insurance Housing Leisure Time Physical Health Resilience Social Support Talents/Skills Transportation Vocational/Educational       Reviewed A&P below on 10/15/16  and same as previous visits except as noted  ASSESSMENT: ADHD predominately inattentive type  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   Physical growth delay       .   Goiter       .   Poor appetite       .   ADHD (attention deficit hyperactivity disorder)       .   Puberty delay       .   Thyroiditis, autoimmune       .   Seizures             Plan: ADHD inattentive type D/c Vyvanse per pt request. If ADHD symptoms become severe will restart Vyvanse 30mg   Seizure disorder Is being treated with Lamictal by Dr. [name]  Labs none during this visit. Therapy patient refuses to see a therapist  Therapy: brief supportive therapy provided. Discussed psychosocial stressors in detail.    Pt denies SI and is at an acute low risk  for suicide.Patient told to call clinic if any problems  occur. Patient advised to go to ER if they should develop SI/HI, side effects, or if symptoms worsen. Has crisis numbers to call if needed. Pt verbalized understanding.  F/up in 3-4 months or sooner if needed        Oletta DarterSalina Chamya Hunton, MD 10/15/2016

## 2016-10-28 ENCOUNTER — Other Ambulatory Visit: Payer: Self-pay | Admitting: Pediatrics

## 2016-10-28 DIAGNOSIS — G40309 Generalized idiopathic epilepsy and epileptic syndromes, not intractable, without status epilepticus: Secondary | ICD-10-CM

## 2016-10-28 DIAGNOSIS — G40109 Localization-related (focal) (partial) symptomatic epilepsy and epileptic syndromes with simple partial seizures, not intractable, without status epilepticus: Secondary | ICD-10-CM

## 2016-10-29 ENCOUNTER — Telehealth (INDEPENDENT_AMBULATORY_CARE_PROVIDER_SITE_OTHER): Payer: Self-pay | Admitting: Pediatrics

## 2016-10-29 NOTE — Telephone Encounter (Signed)
-----   Message from Elveria Risingina Goodpasture, NP sent at 10/29/2016  9:37 AM EST ----- Regarding: Needs appointment Tinnie GensJeffrey needs an appointment with Dr Sharene SkeansHickling or his resident. Thanks,  [name]

## 2016-10-29 NOTE — Telephone Encounter (Signed)
LVM to CB to schedule 60 mo fu appt with Hickling or resident

## 2016-11-16 ENCOUNTER — Other Ambulatory Visit (INDEPENDENT_AMBULATORY_CARE_PROVIDER_SITE_OTHER): Payer: Self-pay | Admitting: *Deleted

## 2016-11-16 DIAGNOSIS — E300 Delayed puberty: Secondary | ICD-10-CM

## 2016-11-16 LAB — T4, FREE: Free T4: 1.2 ng/dL (ref 0.8–1.8)

## 2016-11-16 LAB — TSH: TSH: 0.91 m[IU]/L (ref 0.40–4.50)

## 2016-11-16 LAB — T3, FREE: T3, Free: 3.6 pg/mL (ref 2.3–4.2)

## 2016-11-17 LAB — TESTOSTERONE TOTAL,FREE,BIO, MALES
ALBUMIN: 4.4 g/dL (ref 3.6–5.1)
SEX HORMONE BINDING: 25 nmol/L (ref 10–50)
TESTOSTERONE FREE: 104.5 pg/mL (ref 46.0–224.0)
Testosterone, Bioavailable: 210.4 ng/dL (ref 110.0–575.0)
Testosterone: 594 ng/dL (ref 250–827)

## 2016-11-17 LAB — LUTEINIZING HORMONE: LH: 7.3 m[IU]/mL (ref 1.5–9.3)

## 2016-11-17 LAB — FOLLICLE STIMULATING HORMONE: FSH: 18.9 m[IU]/mL — ABNORMAL HIGH (ref 1.6–8.0)

## 2016-11-18 ENCOUNTER — Encounter (INDEPENDENT_AMBULATORY_CARE_PROVIDER_SITE_OTHER): Payer: Self-pay | Admitting: Pediatrics

## 2016-11-18 ENCOUNTER — Ambulatory Visit (INDEPENDENT_AMBULATORY_CARE_PROVIDER_SITE_OTHER): Payer: BLUE CROSS/BLUE SHIELD | Admitting: Pediatrics

## 2016-11-18 DIAGNOSIS — G40309 Generalized idiopathic epilepsy and epileptic syndromes, not intractable, without status epilepticus: Secondary | ICD-10-CM | POA: Diagnosis not present

## 2016-11-18 DIAGNOSIS — G40109 Localization-related (focal) (partial) symptomatic epilepsy and epileptic syndromes with simple partial seizures, not intractable, without status epilepticus: Secondary | ICD-10-CM | POA: Diagnosis not present

## 2016-11-18 MED ORDER — LAMOTRIGINE ER 200 MG PO TB24: 1.0000 | ORAL_TABLET | Freq: Two times a day (BID) | ORAL | 5 refills | Status: DC

## 2016-11-18 NOTE — Progress Notes (Deleted)
Mark BladeJeffrey D Nicholson is a 21 y.o. male who   Review of Systems: 12 system review was assessed and was negative

## 2016-11-18 NOTE — Progress Notes (Signed)
Patient: Mark Nicholson MRN: [id number] Sex: male DOB: 1996/06/06  Provider: Ellison Carwin, MD Location of Care: [facility]  Note type: Routine return visit  History of Present Illness: Referral Source: Dr. Kriste Basque History from: patient and [facility] chart Chief Complaint: Generalized convulsive epilepsy  Mark Nicholson is a 21 y.o. male who returns November 18, 2016 for the first time since April 24, 2016.  He has localization related and generalized convulsive epilepsy that is well controlled on medication.  He takes Lamictal XR 200 mg twice daily without side effects. No seizures since last visit.   His past history is recorded below.  He works at [employer] in the [employer].  He is working 25 hours a week.  He lives with his parents.  He was doing online courses from [employer] in Special educational needs teacher but is taking a break this semester and will change to a business major next semester.  His thyroiditis is stable.  He is no longer taking Vyvanse.  His general health has been good. He denies alcohol or drug use.  He is sleeping well.  He is driving his own vehicle.  He needs a prescription refill today.  Review of Systems: 12 system review was assessed and was negative  Past Medical History Diagnosis Date  . ADHD (attention deficit hyperactivity disorder)    dx elementary school, sees psychiatrist  . Goiter    sees endo  . Physical growth delay    sees endo  . Poor appetite   . Puberty delay   . Seizures (HCC)    1st in 9th grade, sees neurologist  . Thyroiditis, autoimmune    sees endo   Hospitalizations: No., Head Injury: No., Nervous System Infections: No., Immunizations up to date: Yes.    Nocturnal enuresis until 21 years of age.  Syncope in the fifth grade which caused him to be dazed, motor tic disorder-class in, short stature, delayed puberty, autoimmune thyroiditis with goiter, attention deficit hyperactivity  disorder mixed type, osteochondritis dissecans, Lichen striatus. He is followed by Dr. Molli Knock. The patient also vitiligo on his face.  He had a single generalized tonic-clonic seizure on September 22, 2010.  EEG performed on October 02, 2010 showed brief generalized electrographic seizures of four seconds in duration without clinical accompaniments.  EEG on September 27, 2012 was normal.  Seen in the ER at [facility] due to a seizure on 05/25/14.  He presented on September 07, 2014, following a generalized seizure in the campus dorm room. He was late to take his lamotrigine. This was a brief generalized tonic-clonic seizure without warning and with postictal confusion. He had a lamotrigine level September 26, 2014, that was 12.9 mcg/mL.  Birth History 8 lbs. 1 oz. infant born at [redacted] weeks gestational age tube a 21 year old primigravida.  Gestation complicated by a 27 pound weight gain.  Labor lasted for only one hour. Normal spontaneous vaginal delivery. Nursery course was uneventful. Breast feeding over 11 months.  Growth and development was recalled as normal.   Behavior History ADHD, depression  Surgical History Procedure Laterality Date  . CIRCUMCISION     Family History family history includes Alcohol abuse in his maternal grandfather; Diabetes in his paternal aunt; Lung cancer in his maternal grandfather; Seizures in his maternal grandfather and paternal grandfather; Seizures (age of onset: 69) in his father; Thyroid disease in his paternal aunt. Family history is negative for migraines, intellectual disabilities, blindness, deafness, birth  defects, chromosomal disorder, or autism.  Social History . Marital status: Single    Spouse name: N/A  . Number of children: N/A  . Years of education: N/A   Social History Main Topics  . Smoking status: Never Smoker  . Smokeless tobacco: Never Used  . Alcohol use No  . Drug use: No  . 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    He attends [employer]. 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 He has taken a year off.    He enjoys playing video games and walking.     Lives with this parent and younger sister.    No Known Allergies  Physical Exam BP 130/60   Pulse 88   Ht 5' 9.5" (1.765 m)   Wt 145 lb 6.4 oz (66 kg)   BMI 21.16 kg/m   General: alert, well developed, well nourished, in no acute distress, black hair, brown eyes, right handed Head: normocephalic, no dysmorphic features Ears, Nose and Throat: Otoscopic: tympanic membranes normal; pharynx: oropharynx is pink without exudates or tonsillar hypertrophy Neck: supple, full range of motion, no cranial or cervical bruits Respiratory: auscultation clear Cardiovascular: no murmurs, pulses are normal Musculoskeletal: no skeletal deformities or apparent scoliosis Skin: no rashes or neurocutaneous lesions  Neurologic Exam  Mental Status: alert; oriented to person, place and year; knowledge is normal for age; language is normal Cranial Nerves: visual fields are full to double simultaneous stimuli; extraocular movements are full and conjugate; pupils are round reactive to light; funduscopic examination shows sharp disc margins with normal vessels; symmetric facial strength; midline tongue and uvula; air conduction is greater than bone conduction bilaterally Motor: Normal strength, tone and mass; good fine motor movements; no pronator drift Sensory: intact responses to cold, vibration, proprioception and stereognosis Coordination: good finger-to-nose, rapid repetitive alternating movements and finger apposition Gait and Station: normal gait and station: patient is able to walk on heels, toes and tandem without difficulty; balance is adequate; Romberg exam is negative; Gower response is negative Reflexes: symmetric and 2+ bilaterally; no clonus; bilateral flexor plantar responses  Assessment: 1. Generalized  convulsive epilepsy, G40.309. 2. 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 He will need to continue the medication as long as he is driving.  Plan: Refill prescription for lamotrigine XR 200 mg BID. Return to care in 1 year or sooner as needed.  I discussed transition to an adult neurologist when he is gainfully employed and on his own.   Medication List   Accurate as of 11/18/16  1:50 PM.      LamoTRIgine XR 200 MG Tb24 TAKE 1 TABLET BY MOUTH TWICE DAILY   pimecrolimus 1 % cream Commonly known as:  ELIDEL Apply 1 application topically 2 (two) times daily as needed. Reported on 11/27/2015    The medication list was reviewed and reconciled. All changes or newly prescribed medications were explained.  A complete medication list was provided to the patient/caregiver.  Lelan Ponsaroline [facility], PGY-1  30 minutes of face-to-face time was spent with Mark Nicholson.  I performed physical examination, participated in history taking, and guided decision making.  Deetta PerlaWilliam H Domanic Matusek MD

## 2016-12-01 ENCOUNTER — Encounter (INDEPENDENT_AMBULATORY_CARE_PROVIDER_SITE_OTHER): Payer: Self-pay | Admitting: "Endocrinology

## 2016-12-01 ENCOUNTER — Ambulatory Visit (INDEPENDENT_AMBULATORY_CARE_PROVIDER_SITE_OTHER): Payer: BLUE CROSS/BLUE SHIELD | Admitting: "Endocrinology

## 2016-12-01 VITALS — BP 120/74 | HR 74 | Ht 69.69 in | Wt 146.0 lb

## 2016-12-01 DIAGNOSIS — E300 Delayed puberty: Secondary | ICD-10-CM | POA: Diagnosis not present

## 2016-12-01 DIAGNOSIS — E063 Autoimmune thyroiditis: Secondary | ICD-10-CM

## 2016-12-01 DIAGNOSIS — E049 Nontoxic goiter, unspecified: Secondary | ICD-10-CM | POA: Diagnosis not present

## 2016-12-01 DIAGNOSIS — R6250 Unspecified lack of expected normal physiological development in childhood: Secondary | ICD-10-CM | POA: Diagnosis not present

## 2016-12-01 NOTE — Progress Notes (Signed)
Subjective:  Patient Name: Mark "Gleason Nicholson Date of Birth: [birth year]  MRN: [id number]  Mark Nicholson  presents to the office today for follow-up evaluation and management of his delayed puberty, poor weight gain, growth delay, gynecomastia, vitiligo, seizures, ADD, and short stature.  HISTORY OF PRESENT ILLNESS:   Mark Nicholson is a 21 y.o. African-American young man.  Mark Nicholson was unaccompanied.  1. Mark Nicholson was first seen in our clinic on 01/28/09 after referral by his primary care provider, Dr. Randell Loop of [facility], for evaluation and management of growth delay in association with taking medication for ADHD. The patient was then 13-1/2.  He reportedly had previously been at about the 50% on both the height growth curve and weight growth curve. His appetite decreased markedly on ADD medications. His growth fell off as well. The patient was only hungry at the very end of the day. He was started on cyproheptadine and liberalization of his diet, and responded well. He did try stopping the cyproheptadine but his weight fell.  He also had a delayed start to puberty secondary to poor weight gain and poor growth. Since neither he nor his mother wanted him to take cyproheptadine, I agreed in May 2014 to stop the cyproheptadine as long as he continued to eat and to grow. His appetite did increase later and his growth in weight and height increased accordingly. His puberty also progressed.  2. Mark Nicholson's last [facility] visit was on 06/01/16.   A. In the interim, he has been pretty healthy. His appetite is pretty good. He feels that his vitiligo continues to improve. His arms and legs have healed up. His face is healing. He has not had any further seizures since his last visit. He remains on Lamictal, but stopped the  Vyvanse.  B. He has been followed for pubertal delay. Pubic hair, axillary hair, and genitalia are all continuing to increase in amount/size. Voice is deeper.   3. Pertinent  Review of Systems:  Constitutional: The patient feels "good". He says he has been healthy and active. Eyes: Vision seems to be good with his current eye glasses or contacts. There are no recognized eye problems. Neck: The patient has no complaints of anterior neck swelling, soreness, tenderness, pressure, discomfort, or difficulty swallowing.  Heart: Heart rate increases with exercise or other physical activity. The patient has no complaints of palpitations, irregular heart beats, chest pain, or chest pressure.   Gastrointestinal: Bowel movents seem normal. The patient has no complaints of excessive hunger, acid reflux, upset stomach, stomach aches or pains, diarrhea, or constipation.  Legs: Muscle mass and strength seem normal. There are no complaints of numbness, tingling, burning, or pain. No edema is noted.  Feet: There are no obvious foot problems. There are no complaints of numbness, tingling, burning, or pain. No edema is noted. Neurologic: There are no recognized problems with muscle movement and strength, sensation, or coordination. Chest:  Breast are flat now.  Skin: He continues his treatment with UV light therapy at [facility]. He has the UV therapy 2 times per week.    PAST MEDICAL, FAMILY, AND SOCIAL HISTORY  Past Medical History:  Diagnosis Date  . ADHD (attention deficit hyperactivity disorder)    dx elementary school, sees psychiatrist  . Goiter    sees endo  . Physical growth delay    sees endo  . Poor appetite   . Puberty delay   . Seizures (HCC)    1st in 9th grade, sees neurologist  .  Thyroiditis, autoimmune    sees endo    Family History  Problem Relation Age of Onset  . Seizures Father 58    6-13 yo unresponsive staring, generalized clonic  . Lung cancer Maternal Grandfather   . Seizures Maternal Grandfather     Related to alcohol use and sleep deprivation. treated w phenobarbital  . Alcohol abuse Maternal Grandfather   . Seizures Paternal  Grandfather   . Diabetes Paternal Aunt   . Thyroid disease Paternal Aunt   . Cancer Neg Hx      Current Outpatient Prescriptions:  .  LamoTRIgine XR 200 MG TB24, Take 1 tablet (200 mg total) by mouth 2 (two) times daily., Disp: 62 tablet, Rfl: 5 .  pimecrolimus (ELIDEL) 1 % cream, Apply 1 application topically 2 (two) times daily as needed. Reported on 11/27/2015, Disp: , Rfl:   Allergies as of 12/01/2016  . (No Known Allergies)     reports that he has never smoked. He has never used smokeless tobacco. He reports that he does not drink alcohol or use drugs. Pediatric History  Patient Guardian Status  . Mother:  Danyl, Deems  . Father:  Meline,Emarion   Other Topics Concern  . 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   He attends [employer]. He is Glass blower/designer in [employer], [employer]. He has taken a year off.   He enjoys playing video games and walking.    Lives with this parent and younger sister.         1. School and family: He was a sophomore at [employer] and was taking on-line classes. He wants to study simulation gaming. He is taking a year off from school. He works at [employer] doing a lot of carrying and lifting.  2. Activities: He is not doing much for physical activity outside of work.  3. Primary Care Provider: Terressa Koyanagi., DO, [facility], [place] 4. Neurology: Dr. [name]  REVIEW OF SYSTEMS: There are no other significant problems involving Mark Nicholson's other body systems.   Objective:  Vital Signs:  BP 120/74   Pulse 74   Ht 5' 9.69" (1.77 m)   Wt 146 lb (66.2 kg)   BMI 21.14 kg/m    Ht Readings from Last 3 Encounters:  12/01/16 5' 9.69" (1.77 m)  11/18/16 5' 9.5" (1.765 m)  05/27/16 5' 9.5" (1.765 m)   Wt Readings from Last 3 Encounters:  12/01/16 146 lb (66.2 kg)  11/18/16 145 lb 6.4 oz (66 kg)  06/01/16 142 lb 9.6 oz (64.7 kg)   HC Readings from Last 3 Encounters:  No data found for [facility]   Body  surface area is 1.8 meters squared. Facility age limit for growth percentiles is 20 years. Facility age limit for growth percentiles is 20 years.    PHYSICAL EXAM:  Constitutional: The patient appears healthy, slender, and fit.  His height growth has ceased. He has gained 3.5 pounds in the past 6 months. He is bright and alert.   Head: The head is normocephalic. Face: His vitiligo is better. He has more pink areas and brown areas and no pure white areas. The skin pigment is slowly returning to normal.  Eyes: The eyes appear to be normally formed and spaced. Gaze is conjugate. There is no obvious arcus or proptosis. Moisture appears normal. Ears: The ears are normally placed and appear externally normal. Mouth: The oropharynx and tongue appear normal. Dentition appears to be  normal for age. Oral moisture is normal. Neck: The neck appears to be visibly normal. No carotid bruits are noted. The strap muscles are appropriately enlarged for the amount of lifting he is doing at work. The thyroid gland is smaller at about 22+ grams in size. Today both lobes are symmetrically enlarged. The consistency of the thyroid gland is relatively full. The thyroid gland is not tender to palpation. He has 1+ acanthosis. Lungs: The lungs are clear to auscultation. Air movement is good. Heart: Heart rate and rhythm are regular. Heart sounds S1 and S2 are normal. I did not appreciate any pathologic cardiac murmurs. Abdomen: The abdomen is normal in size for the patient's age. Bowel sounds are normal. There is no obvious hepatomegaly, splenomegaly, or other mass effect.  Arms: Muscle size and bulk are normal for age. Hands: There is no obvious tremor. Phalangeal and metacarpophalangeal joints are normal. Palmar muscles are normal for age. Palmar skin is normal. Palmar moisture is also normal. Legs: Muscles appear normal for age. No edema is present. Neurologic: Strength is normal for age in both the upper and lower  extremities. Muscle tone is normal. Sensation to touch is normal in both legs.    LAB DATA:  11/16/16: TSH 0.91, free T4 1.1, free T3 3.6; LH 7.3, FSH 18.9, testosterone 594, free testosterone 104.5  11/27/15: Testosterone 330.  11/20/15: TSH 1.564, free T4 1.10, free T3 3.2; LH 5.7, FSH 14.3;    02/13/15: LH 8.5, FSH 15.2, testosterone 311  02/12/14: TSH 1.060, Free T4 1.13, free T3 3.4  07/19/13: TSH 0.756, free T4 1.19, free T3 3.6  02/13/13: TSH 1.333, free T4 0.98, free T3 3.2  10/03/12: TSH 1.285, free T4 1.34, free T3 3.5   12/28/11: TSH 2.154, free T4 1.18, free T3 3.6  IMAGING: Bone age on 02/20/13: BA 14 at CA 17. I reviewed the study. The BA was actually 14.5 years.   Assessment and Plan:   ASSESSMENT:  1. Growth delay, physical: His height growth has ceased, c/w his age. He has gained 3.5 pounds since his last visit. It appears that all of his weight gain is muscle.  2. Pubertal delay: His puberty has progressed.  His testosterone and free testosterone have almost doubled in the past 6 months, but [facility] also increased.    3. Decreased appetite/unintentional weight loss: Resolved.   4. Vitiligo: The vitiligo continues to improve. There are no dead-white areas in his face or neck today.    5. Goiter: His thyroid gland is a bit smaller today and the lobes have shifted in size since last visit. The waxing and waning of thyroid gland size is c/w evolving Hashimoto's thyroiditis. He is now euthyroid in January 2018 at the 70% of the normal range.  His past fluctuations in TFTs and the fluctuations of thyroid gland and thyroid lobe size are classic for flare ups of Hashimoto's disease.  6. Hashimoto's Disease: The presence of positive antibodies in the setting of a goiter that waxes and wanes and his positive family history all suggest that Mark BoomDaniel will eventually develop hypothyroidism. At present, however, the thyroiditis is clinically quiescent.  PLAN:  1. Diagnostic: TFTs,  testosterone, LH, FSH, and testosterone prior to next visit.  2. Therapeutic: Eat normally. Try to fit in more exercise. 3. Patient education: We discussed weight growth, his vitiligo, his pubertal course thus far, and his autoimmune diseases. I told him that we need to continue to follow his thyroid status and his  pubertal status about every 6 months. He concurs. 4. Follow-up: 6 months   Level of Service: This visit lasted in excess of 50 minutes. More than 50% of the visit was devoted to counseling.  Molli Knock, MD, CDE Adult and Pediatric Endocrinology

## 2016-12-01 NOTE — Patient Instructions (Signed)
Follow up visit in 6 months. Please repeat lab tests 1-2 weeks prior.  

## 2016-12-03 ENCOUNTER — Ambulatory Visit (INDEPENDENT_AMBULATORY_CARE_PROVIDER_SITE_OTHER): Payer: Self-pay | Admitting: "Endocrinology

## 2016-12-15 ENCOUNTER — Telehealth (INDEPENDENT_AMBULATORY_CARE_PROVIDER_SITE_OTHER): Payer: Self-pay | Admitting: *Deleted

## 2016-12-15 NOTE — Telephone Encounter (Signed)
Mother, Mark Nicholson, dropped off DMV Forms to be completed.  Please contact her for pick up at [phone].  Forms placed on Tina's desk.

## 2016-12-16 NOTE — Telephone Encounter (Signed)
I called Mom and let her know that [facility] was completed and ready to be picked up. TG

## 2016-12-29 ENCOUNTER — Emergency Department (HOSPITAL_COMMUNITY)
Admission: EM | Admit: 2016-12-29 | Discharge: 2016-12-30 | Disposition: A | Discharge status: Home / Self Care | Payer: BLUE CROSS/BLUE SHIELD | Attending: Emergency Medicine | Admitting: Emergency Medicine

## 2016-12-29 ENCOUNTER — Encounter (HOSPITAL_COMMUNITY): Payer: Self-pay

## 2016-12-29 DIAGNOSIS — G40919 Epilepsy, unspecified, intractable, without status epilepticus: Secondary | ICD-10-CM

## 2016-12-29 DIAGNOSIS — F909 Attention-deficit hyperactivity disorder, unspecified type: Secondary | ICD-10-CM | POA: Diagnosis not present

## 2016-12-29 DIAGNOSIS — G40909 Epilepsy, unspecified, not intractable, without status epilepticus: Secondary | ICD-10-CM | POA: Diagnosis present

## 2016-12-29 DIAGNOSIS — Z79899 Other long term (current) drug therapy: Secondary | ICD-10-CM | POA: Diagnosis not present

## 2016-12-29 NOTE — ED Notes (Signed)
Bed: WA08 Expected date:  Expected time:  Means of arrival:  Comments: seizures

## 2016-12-29 NOTE — ED Provider Notes (Addendum)
[facility]-EMERGENCY DEPT Provider Note: Lowella DellJ. Lane Eryca Bolte, MD, FACEP  CSN: [id number] MRN: [id number] ARRIVAL: 12/29/16 at 2333 ROOM: WA08/WA08  By signing my name below, I, Cynda AcresHailei Fulton, attest that this documentation has been prepared under the direction and in the presence of Paula LibraJohn Maximina Pirozzi, MD. Electronically Signed: Cynda AcresHailei Fulton, Scribe. 12/29/16. 11:55 PM.  CHIEF COMPLAINT  Seizure   HISTORY OF PRESENT ILLNESS  Mark Nicholson is a 21 y.o. male with a history of a  seizure disorder, who presents to the emergency department with complaints of a witnessed seizure that happenedJust prior to arrival. Patient does not remember the seizure, but he does remember falling. The episode lasted 30 seconds. Per his friend, the patient was initially postictally confused, but has returned to baseline. Last seizure was one year ago. Patient states he has been compliant with his medication, Lamotrigine XR 200 mg twice a day, but has not taken his evening dose yet. He was instructed to do so during this evaluation.. Patient denies any loss of bowel or bladder control, headache, injury, trauma, or any other symptoms.   Past Medical History:  Diagnosis Date  . ADHD (attention deficit hyperactivity disorder)    dx elementary school, sees psychiatrist  . Goiter    sees endo  . Physical growth delay    sees endo  . Poor appetite   . Puberty delay   . Seizures (HCC)    1st in 9th grade, sees neurologist  . Thyroiditis, autoimmune    sees endo    Past Surgical History:  Procedure Laterality Date  . CIRCUMCISION      Family History  Problem Relation Age of Onset  . Seizures Father 476    6-13 yo unresponsive staring, generalized clonic  . Lung cancer Maternal Grandfather   . Seizures Maternal Grandfather     Related to alcohol use and sleep deprivation. treated w phenobarbital  . Alcohol abuse Maternal Grandfather   . Seizures Paternal Grandfather   . Diabetes Paternal Aunt   . Thyroid disease  Paternal Aunt   . Cancer Neg Hx     Social History  Substance Use Topics  . Smoking status: Never Smoker  . Smokeless tobacco: Never Used  . Alcohol use No    Prior to Admission medications   Medication Sig Start Date End Date Taking? Authorizing Provider  LamoTRIgine XR 200 MG TB24 Take 1 tablet (200 mg total) by mouth 2 (two) times daily. 11/18/16   Deetta PerlaWilliam H Hickling, MD  pimecrolimus (ELIDEL) 1 % cream Apply 1 application topically 2 (two) times daily as needed. Reported on 11/27/2015    Historical Provider, MD    Allergies Patient has no known allergies.   REVIEW OF SYSTEMS  Negative except as noted here or in the History of Present Illness.   PHYSICAL EXAMINATION  Initial Vital Signs Blood pressure 131/69, pulse 120.  Examination General: Well-developed, well-nourished male in no acute distress; appearance consistent with age of record HENT: normocephalic; atraumatic Eyes: pupils equal, round and reactive to light; extraocular muscles intact Neck: supple Heart: regular rate and rhythm Lungs: clear to auscultation bilaterally Abdomen: soft; nondistended; nontender; no masses or hepatosplenomegaly; bowel sounds present Extremities: No deformity; full range of motion; pulses normal Neurologic: Awake, alert and oriented; motor function intact in all extremities and symmetric; no facial droop Skin: Warm and dry Psychiatric: Normal mood and affect   RESULTS  Summary of this visit's results, reviewed by myself:   EKG Interpretation  Date/Time:  Ventricular Rate:    PR Interval:    QRS Duration:   QT Interval:    QTC Calculation:   R Axis:     Text Interpretation:        Laboratory Studies: No results found for this or any previous visit (from the past 24 hour(s)). Imaging Studies: No results found.  ED COURSE  Nursing notes and initial vitals signs, including pulse oximetry, reviewed.  Vitals:   12/29/16 2332 12/29/16 2341 12/30/16 0057  BP: 131/69  118/64 115/65  Pulse: 120 108 88  Resp:  18 20  Temp:  98.9 F (37.2 C) 98.5 F (36.9 C)  TempSrc:  Oral Oral  SpO2:  99% 97%   1:04 AM Patient has remained awake, alert and seizure free in the ED.  PROCEDURES    ED DIAGNOSES     ICD-9-CM ICD-10-CM   1. Breakthrough seizure (HCC) 345.91 G40.919     I personally performed the services described in this documentation, which was scribed in my presence. The recorded information has been reviewed and is accurate.    Paula Libra, MD 12/30/16 0104    Paula Libra, MD 12/30/16 4098

## 2016-12-29 NOTE — ED Notes (Signed)
Pt had a witnessed seizure lasting about 30 secs, postictal about 30 minutes, hx of seizures Pt states he hasn't had a seizure in along time, pt has his meds with him

## 2016-12-29 NOTE — ED Triage Notes (Signed)
Pt doesn't recall the actual seizure but does remembering falling prior to, pt doesn't complain of any injury from the fall

## 2016-12-29 NOTE — ED Notes (Signed)
Provider witnessed patient take his personal evening dose of Lamictal.

## 2017-02-11 ENCOUNTER — Ambulatory Visit (HOSPITAL_COMMUNITY): Payer: Self-pay | Admitting: Psychiatry

## 2017-07-08 ENCOUNTER — Encounter: Payer: Self-pay | Admitting: Family Medicine

## 2017-07-12 ENCOUNTER — Other Ambulatory Visit (INDEPENDENT_AMBULATORY_CARE_PROVIDER_SITE_OTHER): Payer: Self-pay | Admitting: Pediatrics

## 2017-07-12 DIAGNOSIS — G40109 Localization-related (focal) (partial) symptomatic epilepsy and epileptic syndromes with simple partial seizures, not intractable, without status epilepticus: Secondary | ICD-10-CM

## 2017-07-12 DIAGNOSIS — G40309 Generalized idiopathic epilepsy and epileptic syndromes, not intractable, without status epilepticus: Secondary | ICD-10-CM

## 2017-08-08 ENCOUNTER — Other Ambulatory Visit (INDEPENDENT_AMBULATORY_CARE_PROVIDER_SITE_OTHER): Payer: Self-pay | Admitting: Pediatrics

## 2017-08-08 DIAGNOSIS — G40309 Generalized idiopathic epilepsy and epileptic syndromes, not intractable, without status epilepticus: Secondary | ICD-10-CM

## 2017-08-08 DIAGNOSIS — G40109 Localization-related (focal) (partial) symptomatic epilepsy and epileptic syndromes with simple partial seizures, not intractable, without status epilepticus: Secondary | ICD-10-CM

## 2017-09-11 ENCOUNTER — Other Ambulatory Visit (INDEPENDENT_AMBULATORY_CARE_PROVIDER_SITE_OTHER): Payer: Self-pay | Admitting: Pediatrics

## 2017-09-11 DIAGNOSIS — G40109 Localization-related (focal) (partial) symptomatic epilepsy and epileptic syndromes with simple partial seizures, not intractable, without status epilepticus: Secondary | ICD-10-CM

## 2017-09-11 DIAGNOSIS — G40309 Generalized idiopathic epilepsy and epileptic syndromes, not intractable, without status epilepticus: Secondary | ICD-10-CM

## 2017-10-09 ENCOUNTER — Other Ambulatory Visit (INDEPENDENT_AMBULATORY_CARE_PROVIDER_SITE_OTHER): Payer: Self-pay | Admitting: Pediatrics

## 2017-10-09 DIAGNOSIS — G40109 Localization-related (focal) (partial) symptomatic epilepsy and epileptic syndromes with simple partial seizures, not intractable, without status epilepticus: Secondary | ICD-10-CM

## 2017-10-09 DIAGNOSIS — G40309 Generalized idiopathic epilepsy and epileptic syndromes, not intractable, without status epilepticus: Secondary | ICD-10-CM

## 2017-10-28 ENCOUNTER — Encounter: Payer: Self-pay | Admitting: Family Medicine

## 2018-01-11 ENCOUNTER — Other Ambulatory Visit (INDEPENDENT_AMBULATORY_CARE_PROVIDER_SITE_OTHER): Payer: Self-pay | Admitting: Pediatrics

## 2018-01-11 DIAGNOSIS — G40309 Generalized idiopathic epilepsy and epileptic syndromes, not intractable, without status epilepticus: Secondary | ICD-10-CM

## 2018-01-11 DIAGNOSIS — G40109 Localization-related (focal) (partial) symptomatic epilepsy and epileptic syndromes with simple partial seizures, not intractable, without status epilepticus: Secondary | ICD-10-CM

## 2018-02-10 ENCOUNTER — Other Ambulatory Visit (INDEPENDENT_AMBULATORY_CARE_PROVIDER_SITE_OTHER): Payer: Self-pay | Admitting: Neurology

## 2018-02-10 DIAGNOSIS — G40309 Generalized idiopathic epilepsy and epileptic syndromes, not intractable, without status epilepticus: Secondary | ICD-10-CM

## 2018-02-10 DIAGNOSIS — G40109 Localization-related (focal) (partial) symptomatic epilepsy and epileptic syndromes with simple partial seizures, not intractable, without status epilepticus: Secondary | ICD-10-CM

## 2018-02-11 NOTE — Telephone Encounter (Signed)
Spoke with mother and scheduled a follow up. Sent in one refill of lamotrigine

## 2018-03-09 ENCOUNTER — Ambulatory Visit (INDEPENDENT_AMBULATORY_CARE_PROVIDER_SITE_OTHER): Payer: BLUE CROSS/BLUE SHIELD | Admitting: Pediatrics

## 2018-03-09 ENCOUNTER — Encounter (INDEPENDENT_AMBULATORY_CARE_PROVIDER_SITE_OTHER): Payer: Self-pay | Admitting: Pediatrics

## 2018-03-09 VITALS — BP 120/70 | HR 76 | Ht 69.75 in | Wt 156.2 lb

## 2018-03-09 DIAGNOSIS — G40109 Localization-related (focal) (partial) symptomatic epilepsy and epileptic syndromes with simple partial seizures, not intractable, without status epilepticus: Secondary | ICD-10-CM | POA: Diagnosis not present

## 2018-03-09 DIAGNOSIS — G40209 Localization-related (focal) (partial) symptomatic epilepsy and epileptic syndromes with complex partial seizures, not intractable, without status epilepticus: Secondary | ICD-10-CM | POA: Insufficient documentation

## 2018-03-09 DIAGNOSIS — G40309 Generalized idiopathic epilepsy and epileptic syndromes, not intractable, without status epilepticus: Secondary | ICD-10-CM | POA: Diagnosis not present

## 2018-03-09 MED ORDER — LAMOTRIGINE ER 200 MG PO TB24: ORAL_TABLET | ORAL | 5 refills | Status: DC

## 2018-03-09 NOTE — Patient Instructions (Signed)
We will refer you to Dr. Patrcia Dolly of [facility].  Address: [address], [place], [place] [address]  Phone: [phone]  You should make an appointment to be seen within 6 months.

## 2018-03-09 NOTE — Progress Notes (Signed)
Patient: Mark Nicholson MRN: [id number] Sex: male DOB: [birth year]  Provider: Ellison Carwin, MD Location of Care: [facility]  Note type: Routine return visit  History of Present Illness: Referral Source: Dr. Kriste Basque History from: patient and [facility] chart Chief Complaint: Generalized convulsive epilepsy  Mark Nicholson is a 22 y.o. male who returns on Mar 09, 2018 for the first time since November 18, 2016.  He has focal epilepsy with impairment of consciousness and had brief generalized tonic-clonic seizures in the past.  He has been seizure-free since March 2018.  He does not remember the details, but he thinks that he may have had a problem with compliance with his medication.  He was over to friends and may not have brought his medicine.  He has been driving using a permanent license.  At this point, given that he has been 14 months seizure-free, he would be granted a license even if he did not have one.  He tells me that he remembers playing video games with friends who told him he froze and then he lost consciousness.  He did not bite his tongue, have urinary or fecal incontinence.  His friends did not tell him that he had convulsive activity.  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  He sometimes uses a forklift.  He lives in apartment with friends and is planning to move into an apartment on his own.  Review of Systems: A complete review of systems was remarkable for patient reported having one seizure in one year, all other systems reviewed and negative.  Past Medical History Diagnosis Date  . ADHD (attention deficit hyperactivity disorder)    dx elementary school, sees psychiatrist  . Goiter    sees endo  . Physical growth delay    sees endo  . Poor appetite   . Puberty delay   . Seizures (HCC)    1st in 9th grade, sees neurologist  .  Thyroiditis, autoimmune    sees endo   Hospitalizations: No., Head Injury: No., Nervous System Infections: No., Immunizations up to date: Yes.    Nocturnal enuresis until 22 years of age.  Syncope in the fifth grade which caused him to be dazed, motor tic disorder-class in, short stature, delayed puberty, autoimmune thyroiditis with goiter, attention deficit hyperactivity disorder mixed type, osteochondritis dissecans, Lichen striatus. He is followed by Dr. Molli Knock. The patient also vitiligo on his face.  He had a single generalized tonic-clonic seizure on September 22, 2010.  EEG performed on October 02, 2010 showed brief generalized electrographic seizures of four seconds in duration without clinical accompaniments.  EEG on September 27, 2012 was normal.  Seen in the ER at [facility] due to a seizure on 05/25/14.  He presented on September 07, 2014, following a generalized seizure in the campus dorm room. He was late to take his lamotrigine. This was a brief generalized tonic-clonic seizure without warning and with postictal confusion. He had a lamotrigine level September 26, 2014, that was 12.9 mcg/mL.  Birth History 8 lbs. 1 oz. infant born at [redacted] weeks gestational age tube a 22 year old primigravida.  Gestation complicated by a 27 pound weight gain.  Labor lasted for only one hour. Normal spontaneous vaginal delivery. Nursery course was uneventful. Breast feeding over 11 months.  Growth and development was recalled as normal.   Behavior History  ADHD, depression  Surgical History Procedure Laterality Date  . CIRCUMCISION     Family History family history includes Alcohol abuse in his maternal grandfather; Diabetes in his paternal aunt; Lung cancer in his maternal grandfather; Seizures in his maternal grandfather and paternal grandfather; Seizures (age of onset: 58) in his father; Thyroid disease in his paternal aunt. Family history is negative for migraines,  intellectual disabilities, blindness, deafness, birth defects, chromosomal disorder, or autism.  Social History Socioeconomic History  . Marital status: Single  . Years of education:  13 years  . Highest education level:  High school graduate  Occupational History  .  [employer]  Social Needs  . Financial resource strain: Not on file  . Food insecurity:    Worry: Not on file    Inability: Not on file  . Transportation needs:    Medical: Not on file    Non-medical: Not on file  Tobacco Use  . Smoking status: Never Smoker  . Smokeless tobacco: Never Used  Substance and Sexual Activity  . Alcohol use: No    Alcohol/week: 0.0 oz  . Drug use: No  . Sexual activity: Never  Social History Narrative    Mark Nicholson is no long enrolled in school.    He enjoys playing video games and walking.     He is employed at [employer] as a Nature conservation officer.    He has his own apartment now.   No Known Allergies  Physical Exam BP 120/70   Pulse 76   Ht 5' 9.75" (1.772 m)   Wt 156 lb 3.2 oz (70.9 kg)   BMI 22.57 kg/m   General: alert, well developed, well nourished, in no acute distress, black hair, brown eyes, right handed Head: normocephalic, no dysmorphic features Ears, Nose and Throat: Otoscopic: tympanic membranes normal; pharynx: oropharynx is pink without exudates or tonsillar hypertrophy Neck: supple, full range of motion, no cranial or cervical bruits Respiratory: auscultation clear Cardiovascular: no murmurs, pulses are normal Musculoskeletal: no skeletal deformities or apparent scoliosis Skin: no rashes or neurocutaneous lesions  Neurologic Exam  Mental Status: alert; oriented to person, place and year; knowledge is normal for age; language is normal Cranial Nerves: visual fields are full to double simultaneous stimuli; extraocular movements are full and conjugate; pupils are round reactive to light; funduscopic examination shows sharp disc margins with normal  vessels; symmetric facial strength; midline tongue and uvula; air conduction is greater than bone conduction bilaterally Motor: Normal strength, tone and mass; good fine motor movements; no pronator drift Sensory: intact responses to cold, vibration, proprioception and stereognosis Coordination: good finger-to-nose, rapid repetitive alternating movements and finger apposition Gait and Station: normal gait and station: patient is able to walk on heels, toes and tandem without difficulty; balance is adequate; Romberg exam is negative; Gower response is negative Reflexes: symmetric and diminished bilaterally; no clonus; bilateral flexor plantar responses  Assessment 1. Focal epilepsy with impairment of consciousness, G40.109. 2. Generalized convulsive epilepsy, G40.309.  Discussion I am pleased that Mark Nicholson is doing well and appears seizure-free when he is compliant with his medication.  He is now living apart from his family and has gainful employment.  In my opinion, it is time for him to make a transition between Pediatric Neurology and Adult Neurology.  Plan I refilled his prescription for lamotrigine 200 mg XR 1 twice daily.  I will refer him for transition of care to Dr. Patrcia Dolly of  [facility].  I recommended that he see her in  about 6 months' time.  His prescription has refills for the next 6 months.  I told him in the interim, I would be happy to care for him.  I spent 25 minutes of face-to-face time with Mark Nicholson.  We discussed his transition of care, but also driving.  I explained the laws I understand at [facility]  We also talked about the imperative for compliance with medical regimen.  I answered his questions in detail.   Medication List    Accurate as of 03/09/18 11:40 AM.      LamoTRIgine 200 MG Tb24 24 hour tablet TAKE 1 TABLET(200 MG) BY MOUTH TWICE DAILY   pimecrolimus 1 % cream Commonly known as:  ELIDEL Apply 1 application topically 2 (two) times daily as  needed. Reported on 11/27/2015    The medication list was reviewed and reconciled. All changes or newly prescribed medications were explained.  A complete medication list was provided to the patient/caregiver.  Deetta Perla MD

## 2018-07-07 ENCOUNTER — Other Ambulatory Visit: Payer: Self-pay

## 2018-07-07 ENCOUNTER — Telehealth (INDEPENDENT_AMBULATORY_CARE_PROVIDER_SITE_OTHER): Payer: Self-pay | Admitting: Pediatrics

## 2018-07-07 ENCOUNTER — Emergency Department (HOSPITAL_COMMUNITY)
Admission: EM | Admit: 2018-07-07 | Discharge: 2018-07-07 | Disposition: A | Discharge status: Home / Self Care | Payer: BLUE CROSS/BLUE SHIELD | Attending: Emergency Medicine | Admitting: Emergency Medicine

## 2018-07-07 ENCOUNTER — Encounter (HOSPITAL_COMMUNITY): Payer: Self-pay | Admitting: Emergency Medicine

## 2018-07-07 ENCOUNTER — Encounter: Payer: Self-pay | Admitting: Neurology

## 2018-07-07 DIAGNOSIS — R569 Unspecified convulsions: Secondary | ICD-10-CM

## 2018-07-07 DIAGNOSIS — Z79899 Other long term (current) drug therapy: Secondary | ICD-10-CM | POA: Insufficient documentation

## 2018-07-07 LAB — CBC
HEMATOCRIT: 38.7 % — AB (ref 39.0–52.0)
Hemoglobin: 13.8 g/dL (ref 13.0–17.0)
MCH: 32.4 pg (ref 26.0–34.0)
MCHC: 35.7 g/dL (ref 30.0–36.0)
MCV: 90.8 fL (ref 78.0–100.0)
Platelets: 201 10*3/uL (ref 150–400)
RBC: 4.26 MIL/uL (ref 4.22–5.81)
RDW: 12.8 % (ref 11.5–15.5)
WBC: 4.3 10*3/uL (ref 4.0–10.5)

## 2018-07-07 LAB — BASIC METABOLIC PANEL
ANION GAP: 7 (ref 5–15)
BUN: 15 mg/dL (ref 6–20)
CO2: 27 mmol/L (ref 22–32)
Calcium: 9.7 mg/dL (ref 8.9–10.3)
Chloride: 107 mmol/L (ref 98–111)
Creatinine, Ser: 0.95 mg/dL (ref 0.61–1.24)
GFR calc Af Amer: >60 mL/min (ref >60)
GFR calc non Af Amer: >60 mL/min (ref >60)
GLUCOSE: 95 mg/dL (ref 70–99)
POTASSIUM: 4.2 mmol/L (ref 3.5–5.1)
Sodium: 141 mmol/L (ref 135–145)

## 2018-07-07 LAB — CBG MONITORING, ED: Glucose-Capillary: 89 mg/dL (ref 70–99)

## 2018-07-07 NOTE — ED Triage Notes (Signed)
Patient [place] by EMS [place] [employer] [place]. Grand mal seizure witnessed by bystanders , seizure lasted about a minute per EMS. Patient has hx of seizures and takes Lamictal as prescribed. BP 127/69. HR 118, RR 16, SpO2 97% on RA, and  CBG 108 . IV 18 gauge lft. Forearm.

## 2018-07-07 NOTE — Telephone Encounter (Signed)
°  Who's calling (name and relationship to patient) : Cala BradfordKimberly (Mother) Best contact number: [phone] Provider they see: Dr. Sharene SkeansHickling  Reason for call: Mom stated pt had seizure and wanted to see if Dr. Sharene SkeansHickling would be willing to see him. Per mom, Dr. Sharene SkeansHickling referred pt to a general/adult neurologist but pt did not schedule appt. Mom does not remember who Dr. Sharene SkeansHickling referred him to. Mom wanting to know if Dr. Sharene SkeansHickling will see pt one last time. Please advise.

## 2018-07-07 NOTE — ED Notes (Signed)
ED Provider at bedside. 

## 2018-07-07 NOTE — ED Provider Notes (Signed)
[facility]   CSN: [id number] Arrival date & time: 07/07/18  4098     History   Chief Complaint Chief Complaint  Patient presents with  . Seizures    HPI Mark Nicholson is a 22 y.o. male.  HPI Presents after an apparent seizure. Patient was in his usual state of health when he went to sleep last night, awoke in the ambulance, and was told that he had a seizure. He knowledges a seizure history, states that he is compliant with all medication including Lamictal. Last seizure was several months ago, also while he was reportedly taking his Lamictal regularly. He denies physical complaints, denies tongue injury, denies discomfort. EMS reports the patient was found in a auto dealership after bystanders witnessed seizure activity.  Past Medical History:  Diagnosis Date  . ADHD (attention deficit hyperactivity disorder)    dx elementary school, sees psychiatrist  . Goiter    sees endo  . Physical growth delay    sees endo  . Poor appetite   . Puberty delay   . Seizures (HCC)    1st in 9th grade, sees neurologist  . Thyroiditis, autoimmune    sees endo    Patient Active Problem List   Diagnosis Date Noted  . 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 Puberty delay 11/27/2015  . Adjustment disorder with problems at school 10/31/2014  . 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 Vitiligo - Sees Dr. Yetta Barre 05/29/2012  . Physical growth delay   . Goiter   . ADHD (attention deficit hyperactivity disorder), inattentive type   . Thyroiditis, autoimmune - followed by endocrinology     Past Surgical History:  Procedure Laterality Date  . CIRCUMCISION          Home Medications    Prior to Admission medications   Medication Sig Start Date End Date Taking? Authorizing Provider  LamoTRIgine 200 MG TB24  24 hour tablet TAKE 1 TABLET(200 MG) BY MOUTH TWICE DAILY 03/09/18  Yes Hickling, Deanna Artis, MD  pimecrolimus (ELIDEL) 1 % cream Apply 1 application topically 2 (two) times daily as needed. Reported on 11/27/2015    [provider]    Family History Family History  Problem Relation Age of Onset  . Seizures Father 70       6-13 yo unresponsive staring, generalized clonic  . Lung cancer Maternal Grandfather   . Seizures Maternal Grandfather        Related to alcohol use and sleep deprivation. treated w phenobarbital  . Alcohol abuse Maternal Grandfather   . Seizures Paternal Grandfather   . Diabetes Paternal Aunt   . Thyroid disease Paternal Aunt   . Cancer Neg Hx     Social History Social History   Tobacco Use  . Smoking status: Never Smoker  . Smokeless tobacco: Never Used  Substance Use Topics  . Alcohol use: No    Alcohol/week: 0.0 standard drinks  . Drug use: No     Allergies   Patient has no known allergies.   Review of Systems Review of Systems  Constitutional:       Per HPI, otherwise negative  HENT:       Per HPI, otherwise negative  Respiratory:       Per HPI, otherwise negative  Cardiovascular:       Per HPI, otherwise negative  Gastrointestinal: Negative for vomiting.  Endocrine:  Negative aside from HPI  Genitourinary:       Neg aside from HPI   Musculoskeletal:       Per HPI, otherwise negative  Skin: Negative.   Neurological: Positive for seizures. Negative for syncope.     Physical Exam Updated Vital Signs BP 99/63   Pulse 85   Temp 98.2 F (36.8 C) (Oral)   Resp 17   Ht 5\' 10"  (1.778 m)   Wt 63.5 kg   SpO2 98%   BMI 20.09 kg/m   Physical Exam  Constitutional: He is oriented to person, place, and time. He appears well-developed. No distress.  HENT:  Head: Normocephalic and atraumatic.  Eyes: Conjunctivae and EOM are normal.  Cardiovascular: Normal rate and regular rhythm.  Pulmonary/Chest: Effort normal. No stridor.  No respiratory distress.  Abdominal: He exhibits no distension.  Musculoskeletal: He exhibits no edema.  Neurological: He is alert and oriented to person, place, and time. He displays no seizure activity.  Skin: Skin is warm and dry.  Psychiatric: He has a normal mood and affect.  Nursing note and vitals reviewed.    ED Treatments / Results  Labs (all labs ordered are listed, but only abnormal results are displayed) Labs Reviewed  CBC - Abnormal; Notable for the following components:      Result Value   HCT 38.7 (*)    All other components within normal limits  BASIC METABOLIC PANEL  CBG MONITORING, ED     Procedures Procedures (including critical care time)  Medications Ordered in ED Medications - No data to display   Initial Impression / Assessment and Plan / ED Course  I have reviewed the triage vital signs and the nursing notes.  Pertinent labs & imaging results that were available during my care of the patient were reviewed by me and considered in my medical decision making (see chart for details).     11:44 AM On repeat exam the patient is in no distress. He remains hemodynamically unremarkable, labs reviewed, unremarkable as well. The patient is compliant with his medication, he likely had a breakthrough seizure, without other alarming findings, with no ongoing neurologic complaints, patient will follow-up with his neurologist.  Final Clinical Impressions(s) / ED Diagnoses  Seizure   Gerhard MunchLockwood, Maguadalupe Lata, MD 07/07/18 1144

## 2018-07-07 NOTE — Discharge Instructions (Addendum)
As discussed, your evaluation today has been largely reassuring.  But, it is important that you monitor your condition carefully, and do not hesitate to return to the ED if you develop new, or concerning changes in your condition. ? ?Otherwise, please follow-up with your physician for appropriate ongoing care. ? ?

## 2018-07-07 NOTE — ED Notes (Signed)
Bed: WA04 Expected date:  Expected time:  Means of arrival:  Comments: EMS-SZ 

## 2018-07-07 NOTE — Telephone Encounter (Addendum)
Patient has been scheduled for October 7 @ 10:15. Gave mom Dr. Rosalyn GessAquino's office number so that they can call to make an appointment with her. Informed her that if she does not get an appointment with them before the 7th, that Tinnie GensJeffrey still needs to keep his appointment here.

## 2018-07-07 NOTE — Telephone Encounter (Signed)
Spoke with Dr. Sharene SkeansHickling and he is willing to see the patient one last time. He requests that patient make his new patient appointment with the Adult Neurology office

## 2018-07-07 NOTE — ED Notes (Signed)
Patient given discharge teaching and verbalized understanding. Patient ambulated out of ED with a steady gait. 

## 2018-07-08 NOTE — Telephone Encounter (Addendum)
Noted and agree with this plan   

## 2018-07-25 ENCOUNTER — Ambulatory Visit (INDEPENDENT_AMBULATORY_CARE_PROVIDER_SITE_OTHER): Payer: BLUE CROSS/BLUE SHIELD | Admitting: Pediatrics

## 2018-08-31 ENCOUNTER — Other Ambulatory Visit (INDEPENDENT_AMBULATORY_CARE_PROVIDER_SITE_OTHER): Payer: Self-pay | Admitting: Pediatrics

## 2018-08-31 DIAGNOSIS — G40309 Generalized idiopathic epilepsy and epileptic syndromes, not intractable, without status epilepticus: Secondary | ICD-10-CM

## 2018-09-12 ENCOUNTER — Other Ambulatory Visit: Payer: Self-pay

## 2018-09-12 ENCOUNTER — Ambulatory Visit (INDEPENDENT_AMBULATORY_CARE_PROVIDER_SITE_OTHER): Payer: BLUE CROSS/BLUE SHIELD | Admitting: Neurology

## 2018-09-12 ENCOUNTER — Encounter: Payer: Self-pay | Admitting: Neurology

## 2018-09-12 VITALS — BP 108/64 | HR 74 | Ht 71.5 in | Wt 164.0 lb

## 2018-09-12 DIAGNOSIS — G40309 Generalized idiopathic epilepsy and epileptic syndromes, not intractable, without status epilepticus: Secondary | ICD-10-CM | POA: Diagnosis not present

## 2018-09-12 MED ORDER — LAMOTRIGINE ER 250 MG PO TB24: ORAL_TABLET | ORAL | 11 refills | Status: DC

## 2018-09-12 NOTE — Progress Notes (Signed)
NEUROLOGY CONSULTATION NOTE  Mark Nicholson MRN: [id number] DOB: Mar 04, 1996  Referring provider: Dr. Ellison Carwin Primary care provider: Dr. Kriste Basque  Reason for consult:  Establish adult care for epilepsy  Dear Dr [name]:  Thank you for your kind referral of Mark Nicholson for consultation of the above symptoms. Although his history is well known to you, please allow me to reiterate it for the purpose of our medical record. The patient was accompanied to the clinic by his mother who also provides collateral information. Records and images were personally reviewed where available.  HISTORY OF PRESENT ILLNESS: This is a pleasant 22 year old left-handed man with a history of delayed puberty, autoimmune thyroiditis, ADHD, osteochondritis dissecans, generalized epilepsy, presenting to establish adult epilepsy care. His first GTC occurred in 2011. At that time, EEG in 09/2010 reported brief generalized electrographic seizures of 4 seconds duration without clinical accompaniments. He appears to have gone 2 years without seizures then had another seizure in 2013, EEG in 09/2012 reported as normal. He was in the ER for seizures in 03/2013, 05/2014, 08/2014. Lamictal level in 09/2014 was 12.9. He was switched to generic Lamotrigine ER 200mg  BID in 2016, repeat Lamictal level in 11/2014 was 12.9. It appears he was seizure-free for 3 years, then had a breakthrough seizure in 12/2016. His mother reports there was alcohol involved, seizure occurred after a pool party. The last seizure occurred on 07/07/18, he was having his car fixed when he was witnessed by bystanders to have a seizure and EMS was called. CBC and BMP done in the ER were unremarkable. He denies missing medication or taking alcohol. He may have been tired from work the day prior. He denies any prior warning symptoms. His mother has seen him stare then go into a GTC. She has not witnessed any isolated staring/unresponsive episodes. He  denies any myoclonic jerks, olfactory/gustatory hallucinations, gaps in time, rising epigastric sensation, focal numbness/tingling/weakness. He feels a little discombobulated after and would be very tired and more irritable per mother. He denies any headaches, dizziness, vision changes, neck/back pain, bowel/bladder dysfunction. He had a syncopal episode in the fifth grade where he told his parents he could not see, then was diagnosed with a bad strep infection. He has not had further similar symptoms. He has been driving. He now lives alone, his mother reports increased stress now that he is living by himself.  Epilepsy Risk Factors:  His father had seizures in childhood until age 71. His maternal grandfather had seizures. His paternal grandfather may have had seizures. Otherwise he had a normal birth and early development.  There is no history of febrile convulsions, CNS infections such as meningitis/encephalitis, significant traumatic brain injury, neurosurgical procedures.  Diagnostic Data: EEG as above. No prior brain imaging studies available for review.  PAST MEDICAL HISTORY: Past Medical History:  Diagnosis Date  . ADHD (attention deficit hyperactivity disorder)    dx elementary school, sees psychiatrist  . Goiter    sees endo  . Physical growth delay    sees endo  . Poor appetite   . Puberty delay   . Seizures (HCC)    1st in 9th grade, sees neurologist  . Thyroiditis, autoimmune    sees endo    PAST SURGICAL HISTORY: Past Surgical History:  Procedure Laterality Date  . CIRCUMCISION      MEDICATIONS: Current Outpatient Medications on File Prior to Visit  Medication Sig Dispense Refill  . LamoTRIgine 200 MG TB24 24 hour tablet  TAKE 1 TABLET(200 MG) BY MOUTH TWICE DAILY 36 tablet 0  . pimecrolimus (ELIDEL) 1 % cream Apply 1 application topically 2 (two) times daily as needed. Reported on 11/27/2015     No current facility-administered medications on file prior to visit.      ALLERGIES: No Known Allergies  FAMILY HISTORY: Family History  Problem Relation Age of Onset  . Seizures Father 81       6-13 yo unresponsive staring, generalized clonic  . Lung cancer Maternal Grandfather   . Seizures Maternal Grandfather        Related to alcohol use and sleep deprivation. treated w phenobarbital  . Alcohol abuse Maternal Grandfather   . Seizures Paternal Grandfather   . Diabetes Paternal Aunt   . Thyroid disease Paternal Aunt   . Cancer Neg Hx     SOCIAL HISTORY: Social History   Socioeconomic History  . Marital status: Single    Spouse name: Not on file  . Number of children: Not on file  . Years of education: Not on file  . Highest education level: Not on file  Occupational History  . Not on file  Social Needs  . Financial resource strain: Not on file  . Food insecurity:    Worry: Not on file    Inability: Not on file  . Transportation needs:    Medical: Not on file    Non-medical: Not on file  Tobacco Use  . Smoking status: Never Smoker  . Smokeless tobacco: Never Used  Substance and Sexual Activity  . Alcohol use: No    Alcohol/week: 0.0 standard drinks  . Drug use: No  . Sexual activity: Never  Lifestyle  . Physical activity:    Days per week: Not on file    Minutes per session: Not on file  . Stress: Not on file  Relationships  . Social connections:    Talks on phone: Not on file    Gets together: Not on file    Attends religious service: Not on file    Active member of club or organization: Not on file    Attends meetings of clubs or organizations: Not on file    Relationship status: Not on file  . Intimate partner violence:    Fear of current or ex partner: Not on file    Emotionally abused: Not on file    Physically abused: Not on file    Forced sexual activity: Not on file  Other Topics Concern  . Not on file  Social History Narrative   Ramiz is no long enrolled in school.   He enjoys playing video games and  walking.    He is employed at [employer] as a Nature conservation officer.   He has his own apartment now.    REVIEW OF SYSTEMS: Constitutional: No fevers, chills, or sweats, no generalized fatigue, change in appetite Eyes: No visual changes, double vision, eye pain Ear, nose and throat: No hearing loss, ear pain, nasal congestion, sore throat Cardiovascular: No chest pain, palpitations Respiratory:  No shortness of breath at rest or with exertion, wheezes GastrointestinaI: No nausea, vomiting, diarrhea, abdominal pain, fecal incontinence Genitourinary:  No dysuria, urinary retention or frequency Musculoskeletal:  No neck pain, back pain Integumentary: No rash, pruritus, skin lesions Neurological: as above Psychiatric: No depression, insomnia, anxiety Endocrine: No palpitations, fatigue, diaphoresis, mood swings, change in appetite, change in weight, increased thirst Hematologic/Lymphatic:  No anemia, purpura, petechiae. Allergic/Immunologic: no itchy/runny eyes, nasal congestion, recent allergic reactions, rashes  PHYSICAL EXAM: Vitals:   09/12/18 0908  BP: 108/64  Pulse: 74  SpO2: 98%   General: No acute distress Head:  Normocephalic/atraumatic Eyes: Fundoscopic exam shows bilateral sharp discs, no vessel changes, exudates, or hemorrhages Neck: supple, no paraspinal tenderness, full range of motion Back: No paraspinal tenderness Heart: regular rate and rhythm Lungs: Clear to auscultation bilaterally. Vascular: No carotid bruits. Skin/Extremities: No rash, no edema Neurological Exam: Mental status: alert and oriented to person, place, and time, no dysarthria or aphasia, Fund of knowledge is appropriate.  Recent and remote memory are intact. 3/3 delayed recall.  Attention and concentration are normal.    Able to name objects and repeat phrases. Cranial nerves: CN I: not tested CN II: pupils equal, round and reactive to light, visual fields intact, fundi unremarkable. CN III, IV, VI:  full  range of motion, no nystagmus, no ptosis CN V: facial sensation intact CN VII: upper and lower face symmetric CN VIII: hearing intact to finger rub CN IX, X: gag intact, uvula midline CN XI: sternocleidomastoid and trapezius muscles intact CN XII: tongue midline Bulk & Tone: normal, no fasciculations. Motor: 5/5 throughout with no pronator drift. Sensation: intact to light touch, cold, pin, vibration and joint position sense.  No extinction to double simultaneous stimulation.  Romberg test negative Deep Tendon Reflexes: brisk +2 throughout (?slightly more brisk on right UE), negative Hoffman sign, no ankle clonus Plantar responses: downgoing bilaterally Cerebellar: no incoordination on finger to nose, heel to shin. No dysdiadochokinesia Gait: narrow-based and steady, able to tandem walk adequately. Tremor: none  IMPRESSION: This is a pleasant 22 year old left-handed man with a history of delayed puberty, autoimmune thyroiditis, ADHD, osteochondritis dissecans, and generalized epilepsy, presenting to establish adult epilepsy care. He has infrequent seizures, around 1-2 a year, last seizure was 07/07/18 with no clear triggers. He is taking Lamotrigine ER 200mg  BID, we discussed increasing dose slightly to 250mg  BID. Repeat Lamictal level will be checked a week from increasing dose. We discussed EEG findings from 2011 EEG, reporting generalized electrographic seizures of 4 seconds duration without clinical accompaniments. We discussed that 80-90% of patients with this type of epilepsy are well-controlled on medication, we discussed avoidance of seizure triggers including missing medication, alcohol, and sleep deprivation. [place] driving laws were discussed with the patient, and he knows to stop driving after a seizure, until 6 months seizure-free. He will follow-up in 6 months and knows to call for any changes.   Thank you for allowing me to participate in the care of this patient. Please do not hesitate  to call for any questions or concerns.   Patrcia DollyKaren Jakeim Sedore, M.D.  CC: Dr. Sharene SkeansHickling, Dr. Selena BattenKim

## 2018-09-12 NOTE — Patient Instructions (Addendum)
1. Increase Lamictal XR 250mg  twice a day 2. A week after increasing dose, have Lamictal level checked  Leave a message with Leanne ChangMeagen, CMA at [phone]  Your provider has requested that you have LABS drawn in the FUTURE.  Our laboratory is located within [facility], [phone] of this building.  You do not need an appointment however, please come to [facility] prior to going to the lab - we will check you into the lab.     3. As per [place] driving laws, no driving after 6 months seizure-free 4. Follow-up in 6 months, call for any changes  Seizure Precautions: 1. If medication has been prescribed for you to prevent seizures, take it exactly as directed.  Do not stop taking the medicine without talking to your doctor first, even if you have not had a seizure in a long time.   2. Avoid activities in which a seizure would cause danger to yourself or to others.  Don't operate dangerous machinery, swim alone, or climb in high or dangerous places, such as on ladders, roofs, or girders.  Do not drive unless your doctor says you may.  3. If you have any warning that you may have a seizure, lay down in a safe place where you can't hurt yourself.    4.  No driving for 6 months from last seizure, [place].   Please refer to the following link on the Epilepsy Foundation of America's website for more information: http://www.epilepsyfoundation.org/answerplace/Social/driving/drivingu.cfm   5.  Maintain good sleep hygiene. Avoid alcohol.  6.  Contact your doctor if you have any problems that may be related to the medicine you are taking.  7.  Call 911 and bring the patient back to the ED if:        A.  The seizure lasts longer than 5 minutes.       B.  The patient doesn't awaken shortly after the seizure  C.  The patient has new problems such as difficulty seeing, speaking or moving  D.  The patient was injured during the seizure  E.  The patient has a temperature  over 102 F (39C)  F.  The patient vomited and now is having trouble breathing

## 2018-10-20 ENCOUNTER — Other Ambulatory Visit (INDEPENDENT_AMBULATORY_CARE_PROVIDER_SITE_OTHER): Payer: BLUE CROSS/BLUE SHIELD

## 2018-10-20 ENCOUNTER — Other Ambulatory Visit: Payer: Self-pay | Admitting: *Deleted

## 2018-10-20 DIAGNOSIS — Z79899 Other long term (current) drug therapy: Secondary | ICD-10-CM

## 2018-10-22 LAB — LAMOTRIGINE LEVEL: LAMOTRIGINE LVL: 14.6 ug/mL (ref 4.0–18.0)

## 2018-10-25 ENCOUNTER — Telehealth: Payer: Self-pay

## 2018-10-25 NOTE — Telephone Encounter (Signed)
Spoke with pt's mother relaying message below.

## 2018-10-25 NOTE — Telephone Encounter (Signed)
-----   Message from Van Clines, MD sent at 10/24/2018  1:10 PM EST ----- Pls let patient/mother know the lamictal level is good, higher now that he is on a higher dose, continue lamictal 250mg  BID. Thanks

## 2019-02-06 ENCOUNTER — Telehealth: Payer: Self-pay | Admitting: Neurology

## 2019-02-06 MED ORDER — LAMOTRIGINE ER 250 MG PO TB24: ORAL_TABLET | ORAL | 11 refills | Status: DC

## 2019-02-06 NOTE — Telephone Encounter (Signed)
Printed, pls mail out to mother, thanks

## 2019-02-06 NOTE — Telephone Encounter (Signed)
Mailed to patient's mother

## 2019-02-06 NOTE — Telephone Encounter (Signed)
Patient's mother called regarding patient and trying to get assistance for his Lamictal medication. She is needing a copy of his Rx to mail with his application. She is requesting to have the Rx mailed to her? Thanks

## 2019-03-10 ENCOUNTER — Ambulatory Visit: Payer: Self-pay | Admitting: Neurology

## 2019-06-22 ENCOUNTER — Other Ambulatory Visit: Payer: Self-pay | Admitting: Neurology

## 2019-08-04 ENCOUNTER — Telehealth (INDEPENDENT_AMBULATORY_CARE_PROVIDER_SITE_OTHER): Payer: BC Managed Care – PPO | Admitting: Neurology

## 2019-08-04 ENCOUNTER — Encounter: Payer: Self-pay | Admitting: Neurology

## 2019-08-04 ENCOUNTER — Other Ambulatory Visit: Payer: Self-pay

## 2019-08-04 VITALS — Ht 71.0 in | Wt 160.0 lb

## 2019-08-04 DIAGNOSIS — G40309 Generalized idiopathic epilepsy and epileptic syndromes, not intractable, without status epilepticus: Secondary | ICD-10-CM

## 2019-08-04 MED ORDER — LAMOTRIGINE ER 250 MG PO TB24: ORAL_TABLET | ORAL | 3 refills | Status: DC

## 2019-08-04 NOTE — Progress Notes (Signed)
Virtual Visit via Video Note The purpose of this virtual visit is to provide medical care while limiting exposure to the novel coronavirus.    Consent was obtained for video visit:  Yes.   Answered questions that patient had about telehealth interaction:  Yes.   I discussed the limitations, risks, security and privacy concerns of performing an evaluation and management service by telemedicine. I also discussed with the patient that there may be a patient responsible charge related to this service. The patient expressed understanding and agreed to proceed.  Pt location: Home Physician Location: office Name of referring provider:  Terressa Koyanagi, DO I connected with Mark Nicholson at patients initiation/request on 08/04/2019 at  3:30 PM EDT by video enabled telemedicine application and verified that I am speaking with the correct person using two identifiers. Pt MRN:  [id number] Pt DOB:  1996-06-24 Video Participants:  Mark Nicholson   History of Present Illness:  The patient was seen as a virtual video visit on 08/04/2019. He was last seen almost a year ago for generalized epilepsy. He denies any seizures since 07/07/2018. His Lamotrigine ER was increased slightly from 200mg  BID to 250mg  BID last 08/2019. He denies any side effects on medication. He denies any staring/unresponsive episodes, gaps in time, olfactory/gustatory hallucinations, focal numbness/tingling/weakness, myoclonic jerks. No headaches, dizziness, diplopia, no falls. Mood is good. He works in a .   History on Initial Assessment 09/12/2018: This is a pleasant 23 year old left-handed man with a history of delayed puberty, autoimmune thyroiditis, ADHD, osteochondritis dissecans, generalized epilepsy, presenting to establish adult epilepsy care. His first GTC occurred in 2011. At that time, EEG in 09/2010 reported brief generalized electrographic seizures of 4 seconds duration without clinical accompaniments. He appears  to have gone 2 years without seizures then had another seizure in 2013, EEG in 09/2012 reported as normal. He was in the ER for seizures in 03/2013, 05/2014, 08/2014. Lamictal level in 09/2014 was 12.9. He was switched to generic Lamotrigine ER 200mg  BID in 2016, repeat Lamictal level in 11/2014 was 12.9. It appears he was seizure-free for 3 years, then had a breakthrough seizure in 12/2016. His mother reports there was alcohol involved, seizure occurred after a pool party. The last seizure occurred on 07/07/18, he was having his car fixed when he was witnessed by bystanders to have a seizure and EMS was called. CBC and BMP done in the ER were unremarkable. He denies missing medication or taking alcohol. He may have been tired from work the day prior. He denies any prior warning symptoms. His mother has seen him stare then go into a GTC. She has not witnessed any isolated staring/unresponsive episodes. He denies any myoclonic jerks, olfactory/gustatory hallucinations, gaps in time, rising epigastric sensation, focal numbness/tingling/weakness. He feels a little discombobulated after and would be very tired and more irritable per mother. He denies any headaches, dizziness, vision changes, neck/back pain, bowel/bladder dysfunction. He had a syncopal episode in the fifth grade where he told his parents he could not see, then was diagnosed with a bad strep infection. He has not had further similar symptoms. He has been driving. He now lives alone, his mother reports increased stress now that he is living by himself.  Epilepsy Risk Factors:  His father had seizures in childhood until age 44. His maternal grandfather had seizures. His paternal grandfather may have had seizures. Otherwise he had a normal birth and early development.  There is no history of febrile convulsions,  CNS infections such as meningitis/encephalitis, significant traumatic brain injury, neurosurgical procedures.  Diagnostic Data: EEG as above. No  prior brain imaging studies available for review.     Current Outpatient Medications on File Prior to Visit  Medication Sig Dispense Refill  . LamoTRIgine 250 MG TB24 24 hour tablet TAKE 1 TABLET BY MOUTH TWICE DAILY 284 tablet 0  . pimecrolimus (ELIDEL) 1 % cream Apply 1 application topically 2 (two) times daily as needed. Reported on 11/27/2015     No current facility-administered medications on file prior to visit.      Observations/Objective:   Vitals:   08/04/19 1536  Weight: 160 lb (72.6 kg)  Height: 5\' 11"  (1.803 m)   GEN:  The patient appears stated age and is in NAD. He has discolored patches on the face (vitiligo)  Neurological examination: Patient is awake, alert, oriented x 3. No aphasia or dysarthria. Intact fluency and comprehension. Remote and recent memory intact. Able to name and repeat. Cranial nerves: Extraocular movements intact with no nystagmus. No facial asymmetry. Motor: moves all extremities symmetrically, at least anti-gravity x 4. No incoordination on finger to nose testing. Gait: narrow-based and steady, able to tandem walk adequately. Negative Romberg test.  Assessment and Plan:   This is a pleasant 23 yo LH man with a history of delayed puberty, autoimmune thyroiditis, ADHD, osteochondritis dissecans, and generalized epilepsy. He has infrequent seizures, around 1-2 a year, last seizure was 07/07/18 with no clear triggers. Continue Lamotrigine ER 250mg  BID, refills sent. We again discussed avoidance of seizure triggers including missing medication, alcohol, and sleep deprivation. [place] driving laws were discussed with the patient, and he knows to stop driving after a seizure, until 6 months seizure-free. DMV forms will be filled out for him. He will follow-up in 1 year, he knows to call for any changes.   Follow Up Instructions:   -I discussed the assessment and treatment plan with the patient. The patient was provided an opportunity to ask questions and all were  answered. The patient agreed with the plan and demonstrated an understanding of the instructions.   The patient was advised to call back or seek an in-person evaluation if the symptoms worsen or if the condition fails to improve as anticipated.    Cameron Sprang, MD

## 2019-08-16 ENCOUNTER — Telehealth: Payer: BLUE CROSS/BLUE SHIELD | Admitting: Neurology

## 2019-12-22 ENCOUNTER — Telehealth: Payer: Self-pay | Admitting: Neurology

## 2019-12-22 NOTE — Telephone Encounter (Signed)
Patient's mom called and said the patient has ADHD and needs a letter to excuse him from [employer] on 01/12/20 in [place]. She said he will have a hard time sitting still in a courtroom.

## 2019-12-28 ENCOUNTER — Telehealth: Payer: Self-pay | Admitting: Neurology

## 2019-12-28 NOTE — Telephone Encounter (Signed)
Patient's mother called regarding needing a letter to excuse Mark Nicholson from [employer]. She said she would like to pick it up before 01/09/20. Thank you

## 2019-12-29 ENCOUNTER — Encounter: Payer: Self-pay | Admitting: Neurology

## 2019-12-29 NOTE — Telephone Encounter (Signed)
Pt mother called no answer voice mail left that letter will be at front desk for her to pick up

## 2019-12-29 NOTE — Telephone Encounter (Signed)
See prior note, letter done

## 2019-12-29 NOTE — Telephone Encounter (Signed)
Letter done, thanks.

## 2020-08-08 ENCOUNTER — Ambulatory Visit: Payer: BC Managed Care – PPO | Admitting: Neurology

## 2020-11-07 ENCOUNTER — Other Ambulatory Visit: Payer: Self-pay | Admitting: Neurology

## 2020-11-08 MED ORDER — LAMOTRIGINE ER 250 MG PO TB24: ORAL_TABLET | ORAL | 3 refills | Status: DC

## 2020-11-11 ENCOUNTER — Other Ambulatory Visit: Payer: Self-pay

## 2020-11-11 ENCOUNTER — Telehealth: Payer: Self-pay

## 2020-11-11 ENCOUNTER — Telehealth: Payer: Self-pay | Admitting: Neurology

## 2020-11-11 MED ORDER — LAMOTRIGINE ER 250 MG PO TB24: ORAL_TABLET | ORAL | 0 refills | Status: DC

## 2020-11-11 NOTE — Telephone Encounter (Signed)
Sent to [facility]ino to sign, pt does have follow up

## 2020-11-11 NOTE — Telephone Encounter (Signed)
Ntbs.   

## 2020-11-11 NOTE — Telephone Encounter (Signed)
Refilled Lamotrigine 250 #180 to pharmacy. Message left, tried to escribe. Patient has follow up.

## 2020-11-11 NOTE — Telephone Encounter (Signed)
Patient's mother called in and left a message wanting a refill of the patient's Lamotrigine.

## 2020-11-12 ENCOUNTER — Other Ambulatory Visit: Payer: Self-pay

## 2020-11-12 MED ORDER — LAMOTRIGINE ER 250 MG PO TB24: ORAL_TABLET | ORAL | 0 refills | Status: DC

## 2020-11-12 NOTE — Telephone Encounter (Signed)
Refill sent to [facility] on [address]

## 2020-11-19 ENCOUNTER — Telehealth: Payer: Self-pay | Admitting: Neurology

## 2020-11-21 ENCOUNTER — Other Ambulatory Visit: Payer: Self-pay

## 2020-11-21 MED ORDER — LAMOTRIGINE ER 250 MG PO TB24: ORAL_TABLET | ORAL | 0 refills | Status: DC

## 2021-02-05 ENCOUNTER — Encounter (HOSPITAL_COMMUNITY): Payer: Self-pay

## 2021-02-05 ENCOUNTER — Emergency Department (HOSPITAL_COMMUNITY)
Admission: EM | Admit: 2021-02-05 | Discharge: 2021-02-05 | Disposition: A | Discharge status: Home / Self Care | Payer: 59 | Attending: Emergency Medicine | Admitting: Emergency Medicine

## 2021-02-05 ENCOUNTER — Other Ambulatory Visit: Payer: Self-pay

## 2021-02-05 ENCOUNTER — Telehealth: Payer: Self-pay | Admitting: Neurology

## 2021-02-05 DIAGNOSIS — Z79899 Other long term (current) drug therapy: Secondary | ICD-10-CM | POA: Diagnosis not present

## 2021-02-05 DIAGNOSIS — G40909 Epilepsy, unspecified, not intractable, without status epilepticus: Secondary | ICD-10-CM | POA: Insufficient documentation

## 2021-02-05 DIAGNOSIS — R569 Unspecified convulsions: Secondary | ICD-10-CM | POA: Diagnosis present

## 2021-02-05 LAB — CBC WITH DIFFERENTIAL/PLATELET
Abs Immature Granulocytes: 0.01 10*3/uL (ref 0.00–0.07)
Basophils Absolute: 0 10*3/uL (ref 0.0–0.1)
Basophils Relative: 1 %
Eosinophils Absolute: 0.1 10*3/uL (ref 0.0–0.5)
Eosinophils Relative: 3 %
HCT: 39.5 % (ref 39.0–52.0)
Hemoglobin: 13.7 g/dL (ref 13.0–17.0)
Immature Granulocytes: 0 %
Lymphocytes Relative: 34 %
Lymphs Abs: 1 10*3/uL (ref 0.7–4.0)
MCH: 32.2 pg (ref 26.0–34.0)
MCHC: 34.7 g/dL (ref 30.0–36.0)
MCV: 92.7 fL (ref 80.0–100.0)
Monocytes Absolute: 0.2 10*3/uL (ref 0.1–1.0)
Monocytes Relative: 9 %
Neutro Abs: 1.5 10*3/uL — ABNORMAL LOW (ref 1.7–7.7)
Neutrophils Relative %: 53 %
Platelets: 200 10*3/uL (ref 150–400)
RBC: 4.26 MIL/uL (ref 4.22–5.81)
RDW: 12.9 % (ref 11.5–15.5)
WBC: 2.8 10*3/uL — ABNORMAL LOW (ref 4.0–10.5)
nRBC: 0 % (ref 0.0–0.2)

## 2021-02-05 LAB — BASIC METABOLIC PANEL
Anion gap: 9 (ref 5–15)
BUN: 9 mg/dL (ref 6–20)
CO2: 24 mmol/L (ref 22–32)
Calcium: 9.5 mg/dL (ref 8.9–10.3)
Chloride: 107 mmol/L (ref 98–111)
Creatinine, Ser: 0.91 mg/dL (ref 0.61–1.24)
GFR, Estimated: >60 mL/min (ref >60)
Glucose, Bld: 95 mg/dL (ref 70–99)
Potassium: 3.6 mmol/L (ref 3.5–5.1)
Sodium: 140 mmol/L (ref 135–145)

## 2021-02-05 NOTE — ED Notes (Signed)
Friend to bedside at this time 

## 2021-02-05 NOTE — ED Notes (Signed)
Padding applied to bilateral siderails at this time

## 2021-02-05 NOTE — ED Provider Notes (Signed)
[facility]-EMERGENCY DEPT Provider Note   CSN: [id number] Arrival date & time: 02/05/21  1148     History Chief Complaint  Patient presents with  . Seizures    Mark Nicholson is a 25 y.o. male.  Patient with a known history of seizure disorder, had a recurrent seizure today while at work.  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  Patient said he woke up at his seat and everybody around it that he had a seizure and he was brought to the ER for evaluation.  Currently denies any pain or discomfort.  Last seizure episode was about a year ago.  He states he has been taking his medications regularly and has not missed any doses.  Denies fevers cough vomiting or diarrhea.        Past Medical History:  Diagnosis Date  . ADHD (attention deficit hyperactivity disorder)    dx elementary school, sees psychiatrist  . Goiter    sees endo  . Physical growth delay    sees endo  . Poor appetite   . Puberty delay   . Seizures (HCC)    1st in 9th grade, sees neurologist  . Thyroiditis, autoimmune    sees endo    Patient Active Problem List   Diagnosis Date Noted  . 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 Puberty delay 11/27/2015  . Adjustment disorder with problems at school 10/31/2014  . 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 Vitiligo - Sees Dr. Yetta Barre 05/29/2012  . Physical growth delay   . Goiter   . ADHD (attention deficit hyperactivity disorder), inattentive type   . Thyroiditis, autoimmune - followed by endocrinology     Past Surgical History:  Procedure Laterality Date  . CIRCUMCISION         Family History  Problem Relation Age of Onset  . Seizures Father 45       6-13 yo unresponsive staring, generalized clonic  . Lung cancer Maternal  Grandfather   . Seizures Maternal Grandfather        Related to alcohol use and sleep deprivation. treated w phenobarbital  . Alcohol abuse Maternal Grandfather   . Seizures Paternal Grandfather   . Diabetes Paternal Aunt   . Thyroid disease Paternal Aunt   . Cancer Neg Hx     Social History   Tobacco Use  . Smoking status: Never Smoker  . Smokeless tobacco: Never Used  Vaping Use  . Vaping Use: Never used  Substance Use Topics  . Alcohol use: No    Alcohol/week: 0.0 standard drinks  . Drug use: No    Home Medications Prior to Admission medications   Medication Sig Start Date End Date Taking? Authorizing Provider  LamoTRIgine 250 MG TB24 24 hour tablet TAKE 1 TABLET BY MOUTH TWICE DAILY 11/21/20   Van Clines, MD  pimecrolimus (ELIDEL) 1 % cream Apply 1 application topically 2 (two) times daily as needed. Reported on 11/27/2015    [provider]    Allergies    Patient has no known allergies.  Review of Systems   Review of Systems  Constitutional: Negative for fever.  HENT: Negative for ear pain and sore throat.   Eyes: Negative for pain.  Respiratory: Negative for cough.   Cardiovascular: Negative for chest pain.  Gastrointestinal: Negative  for abdominal pain.  Genitourinary: Negative for flank pain.  Musculoskeletal: Negative for back pain.  Skin: Negative for color change and rash.  Neurological: Negative for syncope.  All other systems reviewed and are negative.   Physical Exam Updated Vital Signs BP 119/69   Pulse 91   Temp 98.4 F (36.9 C) (Oral)   Resp 16   SpO2 100%   Physical Exam Constitutional:      General: He is not in acute distress.    Appearance: He is well-developed.  HENT:     Head: Normocephalic.     Nose: Nose normal.  Eyes:     Extraocular Movements: Extraocular movements intact.  Cardiovascular:     Rate and Rhythm: Normal rate.  Pulmonary:     Effort: Pulmonary effort is normal.  Skin:    Coloration: Skin is not  jaundiced.  Neurological:     General: No focal deficit present.     Mental Status: He is alert and oriented to person, place, and time. Mental status is at baseline.     Cranial Nerves: No cranial nerve deficit.     Motor: No weakness.     ED Results / Procedures / Treatments   Labs (all labs ordered are listed, but only abnormal results are displayed) Labs Reviewed  CBC WITH DIFFERENTIAL/PLATELET - Abnormal; Notable for the following components:      Result Value   WBC 2.8 (*)    Neutro Abs 1.5 (*)    All other components within normal limits  BASIC METABOLIC PANEL    EKG None  Radiology No results found.  Procedures Procedures   Medications Ordered in ED Medications - No data to display  ED Course  I have reviewed the triage vital signs and the nursing notes.  Pertinent labs & imaging results that were available during my care of the patient were reviewed by me and considered in my medical decision making (see chart for details).    MDM Rules/Calculators/A&P                          Patient presents with reported breakthrough seizure episode.  He appears well at this time no complaints of pain or discomfort.  Vital signs and exam is benign.  Advised him to follow-up with his neurologist within a week for his breakthrough seizure.  Advised against driving or operating heavy machinery until cleared again by his neurologist.  Patient expressed understanding.  Discharged home in stable condition after period of observation and no additional adverse events.  Final Clinical Impression(s) / ED Diagnoses Final diagnoses:  Seizure disorder [facility])    Rx / DC Orders ED Discharge Orders    None       Cheryll Cockayne, MD 02/05/21 1408

## 2021-02-05 NOTE — ED Triage Notes (Addendum)
Coming from [place], was sitting down, witnessed seizure, has history, takes lamictal BID, had morning dose, denies hitting head

## 2021-02-05 NOTE — Discharge Instructions (Addendum)
Do not drive or operate heavy machinery.  Follow-up with your neurologist within the week.  Return immediately if you have recurrent seizures fevers pain or any additional concerns.

## 2021-02-05 NOTE — Telephone Encounter (Signed)
Patient had a seizure and went to [facility] today. He has appt in June and I put him on wait list as well.  Please call

## 2021-02-05 NOTE — Telephone Encounter (Signed)
Pt c/o: seizure Missed medications?   Sleep deprived?   Alcohol intake?   Back to their usual baseline self?  . If no, advise go to ER Current medications prescribed by Dr. Karel Jarvis:   Unable to leave message 02/05/21 at 210

## 2021-02-23 ENCOUNTER — Encounter (INDEPENDENT_AMBULATORY_CARE_PROVIDER_SITE_OTHER): Payer: Self-pay

## 2021-04-11 ENCOUNTER — Encounter: Payer: Self-pay | Admitting: Neurology

## 2021-04-11 ENCOUNTER — Other Ambulatory Visit: Payer: Self-pay

## 2021-04-11 ENCOUNTER — Ambulatory Visit (INDEPENDENT_AMBULATORY_CARE_PROVIDER_SITE_OTHER): Payer: 59 | Admitting: Neurology

## 2021-04-11 VITALS — BP 104/68 | HR 72 | Ht 72.0 in | Wt 162.8 lb

## 2021-04-11 DIAGNOSIS — G40309 Generalized idiopathic epilepsy and epileptic syndromes, not intractable, without status epilepticus: Secondary | ICD-10-CM

## 2021-04-11 MED ORDER — LAMOTRIGINE ER 250 MG PO TB24: ORAL_TABLET | ORAL | 3 refills | Status: DC

## 2021-04-11 NOTE — Patient Instructions (Signed)
Check Lamictal level before your morning dose  2. Continue Lamotrigine ER 250mg  twice a day  3. Follow-up in 1 year, call for any changes   Seizure Precautions: 1. If medication has been prescribed for you to prevent seizures, take it exactly as directed.  Do not stop taking the medicine without talking to your doctor first, even if you have not had a seizure in a long time.   2. Avoid activities in which a seizure would cause danger to yourself or to others.  Don't operate dangerous machinery, swim alone, or climb in high or dangerous places, such as on ladders, roofs, or girders.  Do not drive unless your doctor says you may.  3. If you have any warning that you may have a seizure, lay down in a safe place where you can't hurt yourself.    4.  No driving for 6 months from last seizure, as per [facility].   Please refer to the following link on the Epilepsy Foundation of America's website for more information: http://www.epilepsyfoundation.org/answerplace/Social/driving/drivingu.cfm   5.  Maintain good sleep hygiene. Avoid alcohol.  6.  Contact your doctor if you have any problems that may be related to the medicine you are taking.  7.  Call 911 and bring the patient back to the ED if:        A.  The seizure lasts longer than 5 minutes.       B.  The patient doesn't awaken shortly after the seizure  C.  The patient has new problems such as difficulty seeing, speaking or moving  D.  The patient was injured during the seizure  E.  The patient has a temperature over 102 F (39C)  F.  The patient vomited and now is having trouble breathing

## 2021-04-11 NOTE — Progress Notes (Signed)
NEUROLOGY FOLLOW UP OFFICE NOTE  Mark Nicholson [id number] February 14, 1996  HISTORY OF PRESENT ILLNESS: I had the pleasure of seeing Mark Nicholson in follow-up in the neurology clinic on 04/11/2021.  The patient was last seen almost 2 years ago for generalized epilepsy. He is again accompanied by his mother who helps supplement the history today.  Records and images were personally reviewed where available.  Since his last visit, he was in the ER for a seizure last 02/05/21. He was at work on break when he had a seizure and woke up on his seat with people around him. No tongue bite or incontinence, no injuries. Prior to this, his last seizure was in 2019. Longest seizure-free interval of 3 years. He denies missing medication, no alcohol. They felt the seizure was due to work overload, exhaustion/fatigue. 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 They deny any staring/unresponsive episodes, gaps in time, olfactory/gustatory hallucinations, focal numbness/tingling/weakness, myoclonic jerks. No headaches, dizziness, vision changes, no falls.Mood is good.    History on Initial Assessment 09/12/2018: This is a pleasant 25 year old left-handed man with a history of delayed puberty, autoimmune thyroiditis, ADHD, osteochondritis dissecans, generalized epilepsy, presenting to establish adult epilepsy care. His first GTC occurred in 2011. At that time, EEG in 09/2010 reported brief generalized electrographic seizures of 4 seconds duration without clinical accompaniments. He appears to have gone 2 years without seizures then had another seizure in 2013, EEG in 09/2012 reported as normal. He was in the ER for seizures in 03/2013, 05/2014, 08/2014. Lamictal level in 09/2014 was 12.9. He was switched to generic Lamotrigine ER 200mg  BID in 2016, repeat Lamictal level in 11/2014 was 12.9. It appears he was seizure-free for 3 years, then had a breakthrough seizure in 12/2016. His mother reports there  was alcohol involved, seizure occurred after a pool party. The last seizure occurred on 07/07/18, he was having his car fixed when he was witnessed by bystanders to have a seizure and EMS was called. CBC and BMP done in the ER were unremarkable. He denies missing medication or taking alcohol. He may have been tired from work the day prior. He denies any prior warning symptoms. His mother has seen him stare then go into a GTC. She has not witnessed any isolated staring/unresponsive episodes. He denies any myoclonic jerks, olfactory/gustatory hallucinations, gaps in time, rising epigastric sensation, focal numbness/tingling/weakness. He feels a little discombobulated after and would be very tired and more irritable per mother. He denies any headaches, dizziness, vision changes, neck/back pain, bowel/bladder dysfunction. He had a syncopal episode in the fifth grade where he told his parents he could not see, then was diagnosed with a bad strep infection. He has not had further similar symptoms. He has been driving. He now lives alone, his mother reports increased stress now that he is living by himself.  Epilepsy Risk Factors:  His father had seizures in childhood until age 11. His maternal grandfather had seizures. His paternal grandfather may have had seizures. Otherwise he had a normal birth and early development.  There is no history of febrile convulsions, CNS infections such as meningitis/encephalitis, significant traumatic brain injury, neurosurgical procedures.  Diagnostic Data: EEG as above. No prior brain imaging studies available for review.    PAST MEDICAL HISTORY: Past Medical History:  Diagnosis Date   ADHD (attention deficit hyperactivity disorder)    dx elementary school, sees psychiatrist   Goiter    sees endo   Physical  growth delay    sees endo   Poor appetite    Puberty delay    Seizures (HCC)    1st in 9th grade, sees neurologist   Thyroiditis, autoimmune    sees endo     MEDICATIONS: Current Outpatient Medications on File Prior to Visit  Medication Sig Dispense Refill   LamoTRIgine 250 MG TB24 24 hour tablet TAKE 1 TABLET BY MOUTH TWICE DAILY 180 tablet 0   pimecrolimus (ELIDEL) 1 % cream Apply 1 application topically 2 (two) times daily as needed. Reported on 11/27/2015     No current facility-administered medications on file prior to visit.    ALLERGIES: No Known Allergies  FAMILY HISTORY: Family History  Problem Relation Age of Onset   Seizures Father 67       6-13 yo unresponsive staring, generalized clonic   Lung cancer Maternal Grandfather    Seizures Maternal Grandfather        Related to alcohol use and sleep deprivation. treated w phenobarbital   Alcohol abuse Maternal Grandfather    Seizures Paternal Grandfather    Diabetes Paternal Aunt    Thyroid disease Paternal Aunt    Cancer Neg Hx     SOCIAL HISTORY: Social History   Socioeconomic History   Marital status: Single    Spouse name: Not on file   Number of children: Not on file   Years of education: Not on file   Highest education level: Not on file  Occupational History   Not on file  Tobacco Use   Smoking status: Never   Smokeless tobacco: Never  Vaping Use   Vaping Use: Never used  Substance and Sexual Activity   Alcohol use: No    Alcohol/week: 0.0 standard drinks   Drug use: No   Sexual activity: Never  Other Topics Concern   Not on file  Social History Narrative   Mark Nicholson is no long enrolled in school.   He enjoys playing video games and walking.    He is employed at [employer] as a Nature conservation officer.   He has his own apartment now on the second floor      Left handed      Highest level of edu- some college      Lives alone   Social Determinants of Health   Financial Resource Strain: Not on file  Food Insecurity: Not on file  Transportation Needs: Not on file  Physical Activity: Not on file  Stress: Not on file  Social Connections: Not on file   Intimate Partner Violence: Not on file     PHYSICAL EXAM: Vitals:   04/11/21 1521  BP: 104/68  Pulse: 72  SpO2: 96%   General: No acute distress Head:  Normocephalic/atraumatic Skin/Extremities: No rash, no edema Neurological Exam: alert and awake. No aphasia or dysarthria. Fund of knowledge is appropriate.  Attention and concentration are normal.   Cranial nerves: Pupils equal, round. Extraocular movements intact with no nystagmus. Visual fields full.  No facial asymmetry.  Motor: Bulk and tone normal, muscle strength 5/5 throughout with no pronator drift.   Finger to nose testing intact.  Gait narrow-based and steady, able to tandem walk adequately.  Romberg negative.   IMPRESSION: This is a pleasant 25 yo LH man with a history of delayed puberty, autoimmune thyroiditis, ADHD, osteochondritis dissecans, and generalized epilepsy. Longest seizure-free interval is 3 years. He had a breakthrough seizure on 02/05/21 in the setting of exhaustion. He is on Lamotrigine ER 250mg  BID, we  will check Lamictal level and may adjust dose if needed. We again discussed [place] driving laws, no driving until 6 months seizure-free. Follow-up in 1 year, he knows to call for any changes.    Thank you for allowing me to participate in his care.  Please do not hesitate to call for any questions or concerns.   Patrcia Dolly, M.D.

## 2021-05-29 ENCOUNTER — Other Ambulatory Visit: Payer: Self-pay

## 2021-05-29 ENCOUNTER — Other Ambulatory Visit (INDEPENDENT_AMBULATORY_CARE_PROVIDER_SITE_OTHER): Payer: 59

## 2021-05-29 DIAGNOSIS — G40309 Generalized idiopathic epilepsy and epileptic syndromes, not intractable, without status epilepticus: Secondary | ICD-10-CM

## 2021-05-31 LAB — LAMOTRIGINE LEVEL: Lamotrigine Lvl: 18.9 ug/mL — ABNORMAL HIGH (ref 4.0–18.0)

## 2021-06-06 ENCOUNTER — Telehealth: Payer: Self-pay | Admitting: Neurology

## 2021-06-06 NOTE — Telephone Encounter (Signed)
Pt's mother left a message returning Heather's call about lab work

## 2021-06-06 NOTE — Telephone Encounter (Signed)
-----   Message from Van Clines, MD sent at 06/01/2021  5:57 AM EDT ----- Pls let mother know Lamictal level is fine, no change in dose of Lamictal, continue as prescribed, thanks

## 2021-06-06 NOTE — Telephone Encounter (Signed)
Pt is returning heathers call. 

## 2021-06-06 NOTE — Telephone Encounter (Signed)
Spoke with pt mother Lamictal level is fine, no change in dose of Lamictal, continue as prescribed

## 2022-02-23 ENCOUNTER — Other Ambulatory Visit: Payer: Self-pay | Admitting: Neurology

## 2022-03-23 ENCOUNTER — Other Ambulatory Visit: Payer: Self-pay | Admitting: Neurology

## 2022-04-06 ENCOUNTER — Telehealth: Payer: Self-pay | Admitting: Neurology

## 2022-04-06 NOTE — Telephone Encounter (Signed)
Has he had any seizures since April 2022? If none, no need to check level before his visit. Thanks

## 2022-04-06 NOTE — Telephone Encounter (Signed)
Patients mom called and asked if Mark Nicholson needs blood work before his appt on June 27?

## 2022-04-07 NOTE — Telephone Encounter (Signed)
Spoke with pt mother he had not had any seizures so no lab work needed prior to appointment

## 2022-04-07 NOTE — Telephone Encounter (Signed)
Pt mother called no answer. Left a voice mail to call the office back when she calls back we need to ask Has he had any seizures since April 2022? If none, no need to check level before his visit.

## 2022-04-14 ENCOUNTER — Encounter: Payer: Self-pay | Admitting: Neurology

## 2022-04-14 ENCOUNTER — Ambulatory Visit: Payer: 59 | Admitting: Neurology

## 2022-04-14 VITALS — BP 129/71 | HR 74 | Resp 18 | Ht 72.0 in | Wt 162.0 lb

## 2022-04-14 DIAGNOSIS — G40309 Generalized idiopathic epilepsy and epileptic syndromes, not intractable, without status epilepticus: Secondary | ICD-10-CM | POA: Diagnosis not present

## 2022-04-14 MED ORDER — LAMOTRIGINE ER 250 MG PO TB24: 1.0000 | ORAL_TABLET | Freq: Two times a day (BID) | ORAL | 3 refills | Status: DC

## 2022-04-14 NOTE — Progress Notes (Signed)
NEUROLOGY FOLLOW UP OFFICE NOTE  Mark Nicholson [id number] [birth year]  HISTORY OF PRESENT ILLNESS: I had the pleasure of seeing Mark Nicholson in follow-up in the neurology clinic on 04/14/2022.  The patient was last seen a year ago for generalized epilepsy. He is alone in the office today.  Records and images were personally reviewed where available. Lamictal level 18.9 in 05/2021. He is on Lamotrigine 250mg  BID without side effects. He reports doing well seizure-free since April 2022. He denies any staring/unresponsive episodes, gaps in time, olfactory/gustatory hallucinations, focal numbness/tingling/weakness, myoclonic jerks. No headaches, dizziness, diplopia, no falls. Mood is good. He gets around 7 hours of sleep. He lives alone, his fiancee will be moving in with him soon. He is driving.   History on Initial Assessment 09/12/2018: This is a pleasant 26 year old left-handed man with a history of delayed puberty, autoimmune thyroiditis, ADHD, osteochondritis dissecans, generalized epilepsy, presenting to establish adult epilepsy care. His first GTC occurred in 2011. At that time, EEG in 09/2010 reported brief generalized electrographic seizures of 4 seconds duration without clinical accompaniments. He appears to have gone 2 years without seizures then had another seizure in 2013, EEG in 09/2012 reported as normal. He was in the ER for seizures in 03/2013, 05/2014, 08/2014. Lamictal level in 09/2014 was 12.9. He was switched to generic Lamotrigine ER 200mg  BID in 2016, repeat Lamictal level in 11/2014 was 12.9. It appears he was seizure-free for 3 years, then had a breakthrough seizure in 12/2016. His mother reports there was alcohol involved, seizure occurred after a pool party. The last seizure occurred on 07/07/18, he was having his car fixed when he was witnessed by bystanders to have a seizure and EMS was called. CBC and BMP done in the ER were unremarkable. He denies missing medication or taking  alcohol. He may have been tired from work the day prior. He denies any prior warning symptoms. His mother has seen him stare then go into a GTC. She has not witnessed any isolated staring/unresponsive episodes. He denies any myoclonic jerks, olfactory/gustatory hallucinations, gaps in time, rising epigastric sensation, focal numbness/tingling/weakness. He feels a little discombobulated after and would be very tired and more irritable per mother. He denies any headaches, dizziness, vision changes, neck/back pain, bowel/bladder dysfunction. He had a syncopal episode in the fifth grade where he told his parents he could not see, then was diagnosed with a bad strep infection. He has not had further similar symptoms. He has been driving. He now lives alone, his mother reports increased stress now that he is living by himself.  Epilepsy Risk Factors:  His father had seizures in childhood until age 42. His maternal grandfather had seizures. His paternal grandfather may have had seizures. Otherwise he had a normal birth and early development.  There is no history of febrile convulsions, CNS infections such as meningitis/encephalitis, significant traumatic brain injury, neurosurgical procedures.  Diagnostic Data: EEG as above. No prior brain imaging studies available for review.   PAST MEDICAL HISTORY: Past Medical History:  Diagnosis Date   ADHD (attention deficit hyperactivity disorder)    dx elementary school, sees psychiatrist   Goiter    sees endo   Physical growth delay    sees endo   Poor appetite    Puberty delay    Seizures (HCC)    1st in 9th grade, sees neurologist   Thyroiditis, autoimmune    sees endo    MEDICATIONS: Current Outpatient Medications on File Prior to Visit  Medication Sig Dispense Refill   LamoTRIgine 250 MG TB24 24 hour tablet TAKE ONE TABLET BY MOUTH TWICE A DAY 60 tablet 0   naproxen (NAPROSYN) 500 MG tablet naproxen 500 mg tablet     pimecrolimus (ELIDEL) 1 % cream  Apply 1 application topically 2 (two) times daily as needed. Reported on 11/27/2015     No current facility-administered medications on file prior to visit.    ALLERGIES: No Known Allergies  FAMILY HISTORY: Family History  Problem Relation Age of Onset   Seizures Father 56       6-13 yo unresponsive staring, generalized clonic   Lung cancer Maternal Grandfather    Seizures Maternal Grandfather        Related to alcohol use and sleep deprivation. treated w phenobarbital   Alcohol abuse Maternal Grandfather    Seizures Paternal Grandfather    Diabetes Paternal Aunt    Thyroid disease Paternal Aunt    Cancer Neg Hx     SOCIAL HISTORY: Social History   Socioeconomic History   Marital status: Single    Spouse name: Not on file   Number of children: Not on file   Years of education: Not on file   Highest education level: Not on file  Occupational History   Not on file  Tobacco Use   Smoking status: Never   Smokeless tobacco: Never  Vaping Use   Vaping Use: Some days  Substance and Sexual Activity   Alcohol use: Yes   Drug use: No   Sexual activity: Never  Other Topics Concern   Not on file  Social History Narrative   Mark Nicholson is no long enrolled in school.   He enjoys playing video games and walking.    He is employed at [employer] as a Nature conservation officer.   He has his own apartment now on the second floor      Left handed      Highest level of edu- some college      Lives alone   Social Determinants of Health   Financial Resource Strain: Not on file  Food Insecurity: Not on file  Transportation Needs: Not on file  Physical Activity: Not on file  Stress: Not on file  Social Connections: Not on file  Intimate Partner Violence: Not on file     PHYSICAL EXAM: Vitals:   04/14/22 1553  BP: 129/71  Pulse: 74  Resp: 18  SpO2: 99%   General: No acute distress Head:  Normocephalic/atraumatic Skin/Extremities: No rash, no edema Neurological Exam: alert and  awake. No aphasia or dysarthria. Fund of knowledge is appropriate.  Attention and concentration are normal.   Cranial nerves: Pupils equal, round. Extraocular movements intact with no nystagmus. Visual fields full.  No facial asymmetry.  Motor: Bulk and tone normal, muscle strength 5/5 throughout with no pronator drift.   Finger to nose testing intact.  Gait narrow-based and steady, able to tandem walk adequately.  Romberg negative.   IMPRESSION: This is a pleasant 26 yo LH man with a history of delayed puberty, autoimmune thyroiditis, ADHD, osteochondritis dissecans, and generalized epilepsy. Longest seizure-free interval is 3 years. He is doing well seizure-free since 01/2021 on Lamotrigine ER 250mg  BID, refills sent. We again discussed avoidance of seizure triggers, including missing medication, sleep deprivation, and alcohol. He is aware of [place] driving laws to stop driving after a seizure until 6 months seizure-free. Follow-up in 1 year, call for any changes.   Thank you for allowing me to participate  in his care.  Please do not hesitate to call for any questions or concerns.    Patrcia Dolly, M.D.

## 2022-06-30 ENCOUNTER — Telehealth: Payer: Self-pay | Admitting: Neurology

## 2022-06-30 MED ORDER — LAMOTRIGINE ER 250 MG PO TB24: 1.0000 | ORAL_TABLET | Freq: Two times a day (BID) | ORAL | 1 refills | Status: DC

## 2022-06-30 MED ORDER — LAMOTRIGINE ER 250 MG PO TB24: 1.0000 | ORAL_TABLET | Freq: Two times a day (BID) | ORAL | 0 refills | Status: DC

## 2022-06-30 NOTE — Telephone Encounter (Signed)
Refill sent in for pt  to [facility]  °

## 2022-06-30 NOTE — Telephone Encounter (Signed)
Pt's mother called back in, she says the prescription for the Lamictal to the wrong pharmacy. It needs to be sent to [facility]

## 2022-06-30 NOTE — Telephone Encounter (Signed)
Sent to [facility] 

## 2022-06-30 NOTE — Telephone Encounter (Signed)
Pts mother called, jeff needs a refill on his lamictal.  [facility] [address]

## 2022-07-26 ENCOUNTER — Other Ambulatory Visit: Payer: Self-pay | Admitting: Neurology

## 2022-11-05 ENCOUNTER — Telehealth: Payer: Self-pay | Admitting: Neurology

## 2022-11-05 MED ORDER — LAMOTRIGINE ER 250 MG PO TB24: 1.0000 | ORAL_TABLET | Freq: Two times a day (BID) | ORAL | 1 refills | Status: DC

## 2022-11-05 NOTE — Telephone Encounter (Signed)
Refills sent to mail order pharmacy.

## 2022-11-05 NOTE — Telephone Encounter (Signed)
Left a message with the after hour service on 11-04-22 @ 5:21 pm   Caller states that she needs a RX  lamotrigine faxed over for patient, she states that he is still has 2 weeks left of medication. Patient Mark Nicholson added to this call and gave consent that his mom [place] can take over his care regarding medication refills. Advised that need to call the office tomorrow regarding refill. They needs a refill sent to mail order number [phone]    Message was put in Dr Delice Lesch Mailbox

## 2022-11-10 ENCOUNTER — Other Ambulatory Visit (HOSPITAL_COMMUNITY): Payer: Self-pay

## 2022-11-18 ENCOUNTER — Other Ambulatory Visit (HOSPITAL_COMMUNITY): Payer: Self-pay

## 2022-11-24 ENCOUNTER — Other Ambulatory Visit (HOSPITAL_COMMUNITY): Payer: Self-pay

## 2022-11-24 ENCOUNTER — Telehealth: Payer: Self-pay

## 2022-11-24 NOTE — Telephone Encounter (Signed)
PA request received via CMM for lamoTRIgine ER 250MG  er tablets  PA has been submitted to [facility] and is pending determination.   Key: [id number]

## 2022-12-17 NOTE — Telephone Encounter (Signed)
Patient Advocate Encounter  Prior Authorization for lamoTRIgine ER '250MG'$  er tablets has been approved.    PA# [id number] Electronic PA Form Effective dates: 11/25/2022 through 11/26/2023      Lyndel Safe, [place] Patient Advocate Specialist [place] Patient Advocate Team Direct Number: [phone]  Fax: [phone]

## 2023-04-01 ENCOUNTER — Encounter: Payer: Self-pay | Admitting: Neurology

## 2023-04-01 ENCOUNTER — Telehealth (INDEPENDENT_AMBULATORY_CARE_PROVIDER_SITE_OTHER): Payer: 59 | Admitting: Neurology

## 2023-04-01 VITALS — Ht 72.0 in | Wt 162.0 lb

## 2023-04-01 DIAGNOSIS — G40309 Generalized idiopathic epilepsy and epileptic syndromes, not intractable, without status epilepticus: Secondary | ICD-10-CM

## 2023-04-01 MED ORDER — LAMOTRIGINE ER 250 MG PO TB24: 1.0000 | ORAL_TABLET | Freq: Two times a day (BID) | ORAL | 3 refills | Status: DC

## 2023-04-01 NOTE — Addendum Note (Signed)
Addended by: Van Clines on: 04/01/2023 11:51 AM   Modules accepted: Level of Service

## 2023-04-01 NOTE — Patient Instructions (Signed)
Good to see you doing well. Refills sent for Lamotrigine ER 250mg  twice a day. Follow-up in 1 year, call for any changes.    Seizure Precautions: 1. If medication has been prescribed for you to prevent seizures, take it exactly as directed.  Do not stop taking the medicine without talking to your doctor first, even if you have not had a seizure in a long time.   2. Avoid activities in which a seizure would cause danger to yourself or to others.  Don't operate dangerous machinery, swim alone, or climb in high or dangerous places, such as on ladders, roofs, or girders.  Do not drive unless your doctor says you may.  3. If you have any warning that you may have a seizure, lay down in a safe place where you can't hurt yourself.    4.  No driving for 6 months from last seizure, as per [facility].   Please refer to the following link on the Epilepsy Foundation of America's website for more information: http://www.epilepsyfoundation.org/answerplace/Social/driving/drivingu.cfm   5.  Maintain good sleep hygiene. Avoid alcohol.  6.  Contact your doctor if you have any problems that may be related to the medicine you are taking.  7.  Call 911 and bring the patient back to the ED if:        A.  The seizure lasts longer than 5 minutes.       B.  The patient doesn't awaken shortly after the seizure  C.  The patient has new problems such as difficulty seeing, speaking or moving  D.  The patient was injured during the seizure  E.  The patient has a temperature over 102 F (39C)  F.  The patient vomited and now is having trouble breathing

## 2023-04-01 NOTE — Progress Notes (Signed)
Virtual Visit via Video Note The purpose of this virtual visit is to provide medical care while limiting exposure to the novel coronavirus.    Consent was obtained for video visit:  Yes.   Answered questions that patient had about telehealth interaction:  Yes.     Pt location: Home Physician Location: office Name of referring provider:  No ref. provider found I connected with Mark Nicholson at patients initiation/request on 04/01/2023 at 10:00 AM EDT by video enabled telemedicine application and verified that I am speaking with the correct person using two identifiers. Pt MRN:  [id number] Pt DOB:  1996/07/19 Video Participants:  Mark Nicholson   History of Present Illness:  The patient had a virtual video visit on 04/01/2023. He was last seen in the neurology clinic a year ago for generalized epilepsy. He is alone for the visit today. Since his last visit, he continues to do well seizure-free since April 2022 on low dose Lamotrigine ER 250mg  BID, no side effects. He denies any staring/unresponsive episodes, gaps in time, olfactory/gustatory hallucinations, focal numbness/tingling/weakness, myoclonic jerks. No headaches, dizziness, vision changes, no falls. He gets at least 7 hours of sleep. He lives with his fiancee, they are getting married next month. Mood is good. He works in [employer], water quality, he is driving.    History on Initial Assessment 09/12/2018: This is a pleasant 27 year old left-handed man with a history of delayed puberty, autoimmune thyroiditis, ADHD, osteochondritis dissecans, generalized epilepsy, presenting to establish adult epilepsy care. His first GTC occurred in 2011. At that time, EEG in 09/2010 reported brief generalized electrographic seizures of 4 seconds duration without clinical accompaniments. He appears to have gone 2 years without seizures then had another seizure in 2013, EEG in 09/2012 reported as normal. He was in the ER for seizures in 03/2013, 05/2014, 08/2014.  Lamictal level in 09/2014 was 12.9. He was switched to generic Lamotrigine ER 200mg  BID in 2016, repeat Lamictal level in 11/2014 was 12.9. It appears he was seizure-free for 3 years, then had a breakthrough seizure in 12/2016. His mother reports there was alcohol involved, seizure occurred after a pool party. The last seizure occurred on 07/07/18, he was having his car fixed when he was witnessed by bystanders to have a seizure and EMS was called. CBC and BMP done in the ER were unremarkable. He denies missing medication or taking alcohol. He may have been tired from work the day prior. He denies any prior warning symptoms. His mother has seen him stare then go into a GTC. She has not witnessed any isolated staring/unresponsive episodes. He denies any myoclonic jerks, olfactory/gustatory hallucinations, gaps in time, rising epigastric sensation, focal numbness/tingling/weakness. He feels a little discombobulated after and would be very tired and more irritable per mother. He denies any headaches, dizziness, vision changes, neck/back pain, bowel/bladder dysfunction. He had a syncopal episode in the fifth grade where he told his parents he could not see, then was diagnosed with a bad strep infection. He has not had further similar symptoms. He has been driving. He now lives alone, his mother reports increased stress now that he is living by himself.  Epilepsy Risk Factors:  His father had seizures in childhood until age 19. His maternal grandfather had seizures. His paternal grandfather may have had seizures. Otherwise he had a normal birth and early development.  There is no history of febrile convulsions, CNS infections such as meningitis/encephalitis, significant traumatic brain injury, neurosurgical procedures.  Diagnostic Data: EEG as  above. No prior brain imaging studies available for review.    Current Outpatient Medications on File Prior to Visit  Medication Sig Dispense Refill   LamoTRIgine 250 MG TB24  24 hour tablet Take 1 tablet by mouth 2 (two) times daily. 180 tablet 1   naproxen (NAPROSYN) 500 MG tablet naproxen 500 mg tablet     pimecrolimus (ELIDEL) 1 % cream Apply 1 application topically 2 (two) times daily as needed. Reported on 11/27/2015     No current facility-administered medications on file prior to visit.     Observations/Objective:   Vitals:   04/01/23 0944  Weight: 162 lb (73.5 kg)  Height: 6' (1.829 m)   GEN:  The patient appears stated age and is in NAD.  Neurological examination: Patient is awake, alert. No aphasia or dysarthria. Intact fluency and comprehension.  Cranial nerves: Extraocular movements intact with no nystagmus. No facial asymmetry. Motor: moves all extremities symmetrically, at least anti-gravity x 4.    Assessment and Plan:   This is a pleasant 27 yo LH man with a history of delayed puberty, autoimmune thyroiditis, ADHD, osteochondritis dissecans, and generalized epilepsy. Longest seizure-free interval is 3 years. He continues to do well seizure-free since April 2022 on low dose Lamotrigine ER 250mg  BID, refills sent. We again discussed avoidance of seizure triggers, including missing medication, sleep deprivation, alcohol. He is aware of [place] driving laws to stop driving after a seizure until 6 months seizure-free. Follow-up in 1 year, call for any changes.    Follow Up Instructions:   -I discussed the assessment and treatment plan with the patient. The patient was provided an opportunity to ask questions and all were answered. The patient agreed with the plan and demonstrated an understanding of the instructions.   The patient was advised to call back or seek an in-person evaluation if the symptoms worsen or if the condition fails to improve as anticipated.    Van Clines, MD

## 2023-05-03 ENCOUNTER — Telehealth: Payer: Self-pay | Admitting: Neurology

## 2023-05-03 NOTE — Telephone Encounter (Signed)
Patient's mother called wanting to know if motion sickness medication will effect patient during his cruise on his honeymoon and if there is any other medication he can use that will help with sea sickness/motion sickness/KB

## 2023-05-03 NOTE — Telephone Encounter (Signed)
Spoke with pt mother who is on the [facility] Motion sickness medication would not interact with his seizure medication and does not typically affect seizures. He can use the over the counter options, however for prescription ones, it has to be through his PCP, we don't prescribe medications for motion sickness

## 2023-05-03 NOTE — Telephone Encounter (Signed)
Motion sickness medication would not interact with his seizure medication and does not typically affect seizures. He can use the over the counter options, however for prescription ones, it has to be through his PCP, we don't prescribe medications for motion sickness, thanks

## 2023-05-05 ENCOUNTER — Telehealth: Payer: Self-pay | Admitting: Neurology

## 2023-05-05 NOTE — Telephone Encounter (Signed)
Patients mother called asking for a Doctors note for son to be given a note for his honeymoon so he doesn't have to stand out in the sun during the boarding process for the cruise ship. Could the note be emailed to patient [email or web address]

## 2023-05-07 NOTE — Telephone Encounter (Signed)
I advised of Dr.Aquinos recommendations

## 2023-05-07 NOTE — Telephone Encounter (Signed)
Would ask PCP for this letter. From a seizure standpoint, the main thing to avoid is alcohol, sleep deprivation, missing medication. Otherwise pls have him enjoy his honeymoon, thanks

## 2023-07-21 ENCOUNTER — Other Ambulatory Visit: Payer: Self-pay | Admitting: Neurology

## 2023-08-25 ENCOUNTER — Telehealth: Payer: Self-pay | Admitting: Neurology

## 2023-08-25 NOTE — Telephone Encounter (Signed)
Pt's mother came in and dropped DMV paperwork off to be filled out. She would like to pick it up once it is complete. Forms are in Dr. Rosalyn Gess box

## 2023-09-02 ENCOUNTER — Encounter: Payer: Self-pay | Admitting: Neurology

## 2023-09-03 NOTE — Telephone Encounter (Signed)
Pls let her know I filled it out, and also just confirm that he has not had any seizures since 2022. Thanks

## 2023-09-06 NOTE — Telephone Encounter (Signed)
Last seizure was in 2022 copy made of paper work, pt mother is going to come and pick up paper work ,

## 2024-01-11 ENCOUNTER — Telehealth: Payer: Self-pay | Admitting: Neurology

## 2024-01-11 MED ORDER — LAMOTRIGINE ER 250 MG PO TB24: 1.0000 | ORAL_TABLET | Freq: Two times a day (BID) | ORAL | 1 refills | Status: DC

## 2024-01-11 NOTE — Telephone Encounter (Signed)
 1. Which medications need to be refilled? (please list name of each medication and dose if known) LamoTRIgine 250 MG TB24 24 hour tablet  2. Which pharmacy/location (including street and city if local pharmacy) is medication to be sent to? [facility] MAILSERV[facility] FAX: [phone]  3. Do they need a 30 day or 90 day supply?

## 2024-01-11 NOTE — Telephone Encounter (Signed)
 Refills have been sent in for pt

## 2024-01-22 ENCOUNTER — Encounter (HOSPITAL_COMMUNITY): Payer: Self-pay

## 2024-01-22 ENCOUNTER — Ambulatory Visit (HOSPITAL_COMMUNITY)
Admission: EM | Admit: 2024-01-22 | Discharge: 2024-01-22 | Disposition: A | Discharge status: Home / Self Care | Attending: Emergency Medicine | Admitting: Emergency Medicine

## 2024-01-22 DIAGNOSIS — L03011 Cellulitis of right finger: Secondary | ICD-10-CM

## 2024-01-22 MED ORDER — IBUPROFEN 600 MG PO TABS: 600.0000 mg | ORAL_TABLET | Freq: Four times a day (QID) | ORAL | 0 refills | Status: AC | PRN

## 2024-01-22 MED ORDER — DOXYCYCLINE HYCLATE 100 MG PO CAPS: 100.0000 mg | ORAL_CAPSULE | Freq: Two times a day (BID) | ORAL | 0 refills | Status: AC

## 2024-01-22 NOTE — Discharge Instructions (Signed)
 Finish the Doxycycline, even if you feel better.  Take 600 mg of ibuprofen with 1000 mg of Tylenol 3-4 times a day as needed for pain.  You may soak this in Epsom salt or salt water several times a day to irrigate this out.  Below is a list of primary care practices who are taking new patients for you to follow-up with.  [facility] -multiple locations.  See website at [email or web address]  [facility] internal medicine clinic Ground Floor - [facility], [address] [place], [place], [place] [address] [phone]  [facility] Primary Care at [facility] [address] [place], [place] [address] [phone]  [facility]- will see patients with no insurance [address] [place], [place] [address] [place], [place] [address] [phone]  [facility] [phone] [address] [place] [address]  [facility]: [address] [place] [address]  [phone]  [facility]: [address] [place] [address]  [phone]   primary care : [address] [place] [address] [phone]  [facility] Primary [facility]: [address] [place] [address] [phone]  [facility]: [address] [place] [address] [phone]  Dr. Oneal Grout [address] [place]  [facility]- will see patients with no insurance. [address] [place], [place] [address] [phone]  Go to www.goodrx.com  or www.costplusdrugs.com to look up your medications. This will give you a list of where you can find your prescriptions at the most affordable prices. Or ask the pharmacist what the cash price is, or if they have any other discount programs available to help make your medication more affordable. This can be less  expensive than what you would pay with insurance.

## 2024-01-22 NOTE — ED Provider Notes (Signed)
 HPI  SUBJECTIVE:  Mark Nicholson is a right-handed 28 y.o. male who presents with pain described as soreness, erythema, swelling along the nail fold of his right index finger starting 2 days ago.  The pain is present with palpation only.  He bites his nails.  No trauma to the area.  He denies pulling a recent hangnail.  No fevers, body aches, diffuse finger swelling, erythema streaking up his finger, limitation of motion.  He tried Neosporin and a Band-Aid without improvement of symptoms.  Symptoms worse with palpation.  He has a past medical history of seizures.  No history of MRSA.  PCP: None.   Past Medical History:  Diagnosis Date   ADHD (attention deficit hyperactivity disorder)    dx elementary school, sees psychiatrist   Goiter    sees endo   Physical growth delay    sees endo   Poor appetite    Puberty delay    Seizures (HCC)    1st in 9th grade, sees neurologist   Thyroiditis, autoimmune    sees endo    Past Surgical History:  Procedure Laterality Date   CIRCUMCISION      Family History  Problem Relation Age of Onset   Seizures Father 70       6-13 yo unresponsive staring, generalized clonic   Lung cancer Maternal Grandfather    Seizures Maternal Grandfather        Related to alcohol use and sleep deprivation. treated w phenobarbital   Alcohol abuse Maternal Grandfather    Seizures Paternal Grandfather    Diabetes Paternal Aunt    Thyroid disease Paternal Aunt    Cancer Neg Hx     Social History   Tobacco Use   Smoking status: Never   Smokeless tobacco: Never  Vaping Use   Vaping status: Some Days  Substance Use Topics   Alcohol use: Yes   Drug use: No    No current facility-administered medications for this encounter.  Current Outpatient Medications:    doxycycline (VIBRAMYCIN) 100 MG capsule, Take 1 capsule (100 mg total) by mouth 2 (two) times daily for 7 days., Disp: 14 capsule, Rfl: 0   ibuprofen (ADVIL) 600 MG tablet, Take 1 tablet (600 mg  total) by mouth every 6 (six) hours as needed., Disp: 30 tablet, Rfl: 0   LamoTRIgine 250 MG TB24 24 hour tablet, Take 1 tablet (250 mg total) by mouth 2 (two) times daily., Disp: 180 tablet, Rfl: 1  No Known Allergies   ROS  As noted in HPI.   Physical Exam  BP 112/64 (BP Location: Left Arm)   Pulse 71   Temp 98.3 F (36.8 C) (Oral)   Resp 16   SpO2 98%   Constitutional: Well developed, well nourished, no acute distress Eyes:  EOMI, conjunctiva normal bilaterally HENT: Normocephalic, atraumatic,mucus membranes moist Respiratory: Normal inspiratory effort Cardiovascular: Normal rate GI: nondistended skin: No rash, skin intact Musculoskeletal: Right index finger with erythema, swelling, tenderness around the nailbed with no obvious purulence.  No evidence of felon.  2-point discrimination intact.  Cap refill less than 2 seconds.  No tenderness over the flexor tendons.     Neurologic: Alert & oriented x 3, no focal neuro deficits Psychiatric: Speech and behavior appropriate   ED Course   Medications - No data to display  No orders of the defined types were placed in this encounter.   No results found for this or any previous visit (from the past 24 hours). No  results found.  ED Clinical Impression  1. Paronychia of right index finger      ED Assessment/Plan     Procedure note: After obtaining verbal consent, I had the patient wash the hands extensively with soap and water.  Then cleaned the area with alcohol and chlorhexidine.  Used topical cold spray to numb the area with adequate anesthesia, made a single stab incision with a sterile 18-gauge needle, no expressible purulent drainage.  Applied Band-Aid.  Patient tolerated procedure well.  Home with doxycycline 100 mg p.o. twice daily for a week, Tylenol/ibuprofen 3-4 times a day as needed.  Follow-up with PCP of choice or may return here if not improving or if he gets worse.  Will provide primary care list for  routine care  Discussed MDM, treatment plan, and plan for follow-up with patient.. patient agrees with plan.   Meds ordered this encounter  Medications   ibuprofen (ADVIL) 600 MG tablet    Sig: Take 1 tablet (600 mg total) by mouth every 6 (six) hours as needed.    Dispense:  30 tablet    Refill:  0   doxycycline (VIBRAMYCIN) 100 MG capsule    Sig: Take 1 capsule (100 mg total) by mouth 2 (two) times daily for 7 days.    Dispense:  14 capsule    Refill:  0      *This clinic note was created using Scientist, clinical (histocompatibility and immunogenetics). Therefore, there may be occasional mistakes despite careful proofreading.  ?    Domenick Gong, MD 01/23/24 1143

## 2024-01-22 NOTE — ED Triage Notes (Signed)
 Patient reports swollen area to right index finger he noticed a couple of days ago. Patient states it is painful to touch, denies drainage.

## 2024-03-31 ENCOUNTER — Ambulatory Visit: Payer: 59 | Admitting: Neurology

## 2024-04-04 ENCOUNTER — Ambulatory Visit: Payer: 59 | Admitting: Neurology

## 2024-04-07 ENCOUNTER — Ambulatory Visit (INDEPENDENT_AMBULATORY_CARE_PROVIDER_SITE_OTHER): Payer: 59 | Admitting: Neurology

## 2024-04-07 ENCOUNTER — Encounter: Payer: Self-pay | Admitting: Neurology

## 2024-04-07 VITALS — BP 114/72 | HR 81 | Ht 72.0 in | Wt 153.6 lb

## 2024-04-07 DIAGNOSIS — G40309 Generalized idiopathic epilepsy and epileptic syndromes, not intractable, without status epilepticus: Secondary | ICD-10-CM | POA: Diagnosis not present

## 2024-04-07 MED ORDER — LAMOTRIGINE ER 250 MG PO TB24: 1.0000 | ORAL_TABLET | Freq: Two times a day (BID) | ORAL | 4 refills | Status: DC

## 2024-04-07 NOTE — Progress Notes (Signed)
 NEUROLOGY FOLLOW UP OFFICE NOTE  Mark Nicholson [id number] Oct 11, 1996  HISTORY OF PRESENT ILLNESS: I had the pleasure of seeing Mark Nicholson in follow-up in the neurology clinic on 04/07/2024.  The patient was last seen a year ago for generalized epilepsy. He is alone in the office today.  Records and images were personally reviewed where available. Since his last visit, he continues to do well seizure-free since April 2022 on low dose Lamotrigine  ER 250mg  BID, no side effects. He denies any staring/unresponsive episodes, gaps in time, olfactory/gustatory hallucinations, focal numbness/tingling/weakness, myoclonic jerks. No headaches, dizziness, vision changes, no falls. He gets 6 hours of sleep. Mood is good. He lives with his wife. He works at [employer]. He is driving.    History on Initial Assessment 09/12/2018: This is a pleasant 28 year old left-handed man with a history of delayed puberty, autoimmune thyroiditis, ADHD, osteochondritis dissecans, generalized epilepsy, presenting to establish adult epilepsy care. His first GTC occurred in 2011. At that time, EEG in 09/2010 reported brief generalized electrographic seizures of 4 seconds duration without clinical accompaniments. He appears to have gone 2 years without seizures then had another seizure in 2013, EEG in 09/2012 reported as normal. He was in the ER for seizures in 03/2013, 05/2014, 08/2014. Lamictal  level in 09/2014 was 12.9. He was switched to generic Lamotrigine  ER 200mg  BID in 2016, repeat Lamictal  level in 11/2014 was 12.9. It appears he was seizure-free for 3 years, then had a breakthrough seizure in 12/2016. His mother reports there was alcohol involved, seizure occurred after a pool party. The last seizure occurred on 07/07/18, he was having his car fixed when he was witnessed by bystanders to have a seizure and EMS was called. CBC and BMP done in the ER were unremarkable. He denies missing medication or taking alcohol. He  may have been tired from work the day prior. He denies any prior warning symptoms. His mother has seen him stare then go into a GTC. She has not witnessed any isolated staring/unresponsive episodes. He denies any myoclonic jerks, olfactory/gustatory hallucinations, gaps in time, rising epigastric sensation, focal numbness/tingling/weakness. He feels a little discombobulated after and would be very tired and more irritable per mother. He denies any headaches, dizziness, vision changes, neck/back pain, bowel/bladder dysfunction. He had a syncopal episode in the fifth grade where he told his parents he could not see, then was diagnosed with a bad strep infection. He has not had further similar symptoms. He has been driving. He now lives alone, his mother reports increased stress now that he is living by himself.  Epilepsy Risk Factors:  His father had seizures in childhood until age 29. His maternal grandfather had seizures. His paternal grandfather may have had seizures. Otherwise he had a normal birth and early development.  There is no history of febrile convulsions, CNS infections such as meningitis/encephalitis, significant traumatic brain injury, neurosurgical procedures.  Diagnostic Data: EEG as above. No prior brain imaging studies available for review.   PAST MEDICAL HISTORY: Past Medical History:  Diagnosis Date   ADHD (attention deficit hyperactivity disorder)    dx elementary school, sees psychiatrist   Goiter    sees endo   Physical growth delay    sees endo   Poor appetite    Puberty delay    Seizures (HCC)    1st in 9th grade, sees neurologist   Thyroiditis, autoimmune    sees endo    MEDICATIONS: Current Outpatient Medications on File Prior to Visit  Medication Sig Dispense Refill   ibuprofen  (ADVIL ) 600 MG tablet Take 1 tablet (600 mg total) by mouth every 6 (six) hours as needed. 30 tablet 0   LamoTRIgine  250 MG TB24 24 hour tablet Take 1 tablet (250 mg total) by mouth 2  (two) times daily. 180 tablet 1   No current facility-administered medications on file prior to visit.    ALLERGIES: No Known Allergies  FAMILY HISTORY: Family History  Problem Relation Age of Onset   Seizures Father 10       6-13 yo unresponsive staring, generalized clonic   Lung cancer Maternal Grandfather    Seizures Maternal Grandfather        Related to alcohol use and sleep deprivation. treated w phenobarbital   Alcohol abuse Maternal Grandfather    Seizures Paternal Grandfather    Diabetes Paternal Aunt    Thyroid  disease Paternal Aunt    Cancer Neg Hx     SOCIAL HISTORY: Social History   Socioeconomic History   Marital status: Single    Spouse name: Not on file   Number of children: Not on file   Years of education: Not on file   Highest education level: Not on file  Occupational History   Not on file  Tobacco Use   Smoking status: Never   Smokeless tobacco: Never  Vaping Use   Vaping status: Some Days  Substance and Sexual Activity   Alcohol use: Yes   Drug use: No   Sexual activity: Never  Other Topics Concern   Not on file  Social History Narrative   Mark Nicholson is no long enrolled in school.   He enjoys playing video games and walking.    He is employed at [employer] as a Nature conservation officer.   He has his own apartment now on the second floor      Left handed      Highest level of edu- some college      Lives alone   Social Drivers of Health   Financial Resource Strain: Not on file  Food Insecurity: Not on file  Transportation Needs: Not on file  Physical Activity: Not on file  Stress: Not on file  Social Connections: Not on file  Intimate Partner Violence: Not on file     PHYSICAL EXAM: Vitals:   04/07/24 1121  BP: 114/72  Pulse: 81  SpO2: 99%   General: No acute distress Head:  Normocephalic/atraumatic Skin/Extremities: No rash, no edema Neurological Exam: alert and awake. No aphasia or dysarthria. Fund of knowledge is appropriate.   Attention and concentration are normal.   Cranial nerves: Pupils equal, round. Extraocular movements intact with no nystagmus. Visual fields full.  No facial asymmetry.  Motor: Bulk and tone normal, muscle strength 5/5 throughout with no pronator drift.   Finger to nose testing intact.  Gait narrow-based and steady, able to tandem walk adequately.  Romberg negative.   IMPRESSION: This is a pleasant 28 yo LH man with a history of delayed puberty, autoimmune thyroiditis, ADHD, osteochondritis dissecans, and generalized epilepsy. Longest seizure-free interval is 3 years. He has been seizure-free since April 2022 on low dose Lamotrigine  ER 250mg  BID. We again discussed avoidance of seizure triggers, including missing medication, sleep deprivation, alcohol. He is aware of [place] driving laws to stop driving after a seizure until 6 months seizure-free. Follow-up in 1 year, call for any changes.    Thank you for allowing me to participate in his care.  Please do not hesitate to call  for any questions or concerns.    Rayfield Cairo, M.D.

## 2024-04-07 NOTE — Patient Instructions (Signed)
 Always a pleasure to see you. Continue Lamotrigine  ER 250mg  twice a day. Follow-up in 1 year, call for any changes.    Seizure Precautions: 1. If medication has been prescribed for you to prevent seizures, take it exactly as directed.  Do not stop taking the medicine without talking to your doctor first, even if you have not had a seizure in a long time.   2. Avoid activities in which a seizure would cause danger to yourself or to others.  Don't operate dangerous machinery, swim alone, or climb in high or dangerous places, such as on ladders, roofs, or girders.  Do not drive unless your doctor says you may.  3. If you have any warning that you may have a seizure, lay down in a safe place where you can't hurt yourself.    4.  No driving for 6 months from last seizure, as per [place]  state law.   Please refer to the following link on the [employer]'s website for more information: http://www.epilepsyfoundation.org/answerplace/Social/driving/drivingu.cfm   5.  Maintain good sleep hygiene. Avoid alcohol  6.  Contact your doctor if you have any problems that may be related to the medicine you are taking.  7.  Call 911 and bring the patient back to the ED if:        A.  The seizure lasts longer than 5 minutes.       B.  The patient doesn't awaken shortly after the seizure  C.  The patient has new problems such as difficulty seeing, speaking or moving  D.  The patient was injured during the seizure  E.  The patient has a temperature over 102 F (39C)  F.  The patient vomited and now is having trouble breathing

## 2024-06-24 ENCOUNTER — Other Ambulatory Visit: Payer: Self-pay | Admitting: Neurology

## 2024-06-26 NOTE — Telephone Encounter (Signed)
**Note De-identified  [place] Obfuscation** Please advise 

## 2024-09-06 ENCOUNTER — Encounter: Payer: Self-pay | Admitting: Neurology

## 2025-03-30 ENCOUNTER — Ambulatory Visit: Admitting: Neurology

## 2025-04-09 ENCOUNTER — Ambulatory Visit: Admitting: Neurology
